# Patient Record
Sex: Female | Born: 1995 | Race: Black or African American | Hispanic: No | Smoking: Never smoker
Health system: Southern US, Community
[De-identification: ages and names within clinical notes are randomized; demographics above are authoritative.]

## PROBLEM LIST (undated history)

## (undated) DIAGNOSIS — D649 Anemia, unspecified: Secondary | ICD-10-CM

## (undated) DIAGNOSIS — J45909 Unspecified asthma, uncomplicated: Secondary | ICD-10-CM

## (undated) HISTORY — DX: Anemia, unspecified: D64.9

---
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
## 2015-05-04 ENCOUNTER — Telehealth: Payer: Self-pay | Admitting: Family Medicine

## 2015-05-04 NOTE — Telephone Encounter (Signed)
Appt scheduled for 06/01/15. Patient aware.

## 2015-06-01 ENCOUNTER — Ambulatory Visit: Payer: Self-pay | Admitting: Family Medicine

## 2015-07-13 ENCOUNTER — Ambulatory Visit: Payer: Self-pay | Admitting: Pediatrics

## 2015-07-16 ENCOUNTER — Ambulatory Visit: Payer: Self-pay | Admitting: Pediatrics

## 2015-07-16 ENCOUNTER — Encounter: Payer: Self-pay | Admitting: Pediatrics

## 2015-07-16 VITALS — BP 132/74 | Ht 63.0 in | Wt 204.0 lb

## 2015-07-16 DIAGNOSIS — Z6836 Body mass index (BMI) 36.0-36.9, adult: Secondary | ICD-10-CM

## 2015-07-16 DIAGNOSIS — Z0000 Encounter for general adult medical examination without abnormal findings: Secondary | ICD-10-CM

## 2015-07-16 DIAGNOSIS — Z113 Encounter for screening for infections with a predominantly sexual mode of transmission: Secondary | ICD-10-CM

## 2015-07-16 DIAGNOSIS — Z30011 Encounter for initial prescription of contraceptive pills: Secondary | ICD-10-CM

## 2015-07-16 MED ORDER — LEVONORGESTREL-ETHINYL ESTRAD 0.1-20 MG-MCG PO TABS: 1.0000 | ORAL_TABLET | Freq: Every day | ORAL | Status: DC

## 2015-07-16 NOTE — Progress Notes (Signed)
    Subjective:    Patient ID: Dorothy Whitaker, female    DOB: May 26, 1996, 19 y.o.   MRN: [id number]  HPI: Dorothy Whitaker is a 19 y.o. female presenting on 07/16/2015 for complete physical exam.  Overall has been healthy per pt. She has not seen in doctor in apprx 5 yrs. She noticed the skin darkening around her neck within the last year. Weight has been up and down per pt, depending on how much she works out.  Going to [facility] [facility] in [place].  No smoking, no EtOH. No drugs.   LMP: 8/16-8/21. Periods skip months, irregular. Recently have been apprx every month, has gone 4 months between periods. Last for 1 week. First days changing pads/tampons every 2-3 hours.Uses condoms for birth control. One lifetime partner, no prior testing.  No headaches, no weakness, does fine with exercise--no SOB. No cough or recent fevers.  Relevant past medical, surgical, family and social history reviewed and updated as indicated. Interim medical history since our last visit reviewed. Allergies and medications reviewed and updated.   ROS: All systems negative other than what is in HPI.  Past Medical History Patient Active Problem List   Diagnosis Date Noted  . Body mass index (BMI) of 36.0-36.9 in adult 07/17/2015    Current Outpatient Prescriptions  Medication Sig Dispense Refill  . levonorgestrel-ethinyl estradiol (AVIANE,ALESSE,LESSINA) 0.1-20 MG-MCG tablet Take 1 tablet by mouth daily. 1 Package 11   No current facility-administered medications for this visit.       Objective:    Ht  (1.6 m)  Wt 204 lb (92.534 kg)  BMI 36.15 kg/m2  Wt Readings from Last 3 Encounters:  07/16/15 204 lb (92.534 kg) (98 %*, Z = 2.00)   * Growth percentiles are based on CDC 2-20 Years data.     Gen: NAD, alert, cooperative with exam, NCAT EYES: EOMI, no scleral injection or icterus ENT:  TMs pearly gray b/l, OP without erythema LYMPH: no cervical LAD CV: NRRR, normal S1/S2, no murmur, DP  pulses 2+ b/l Resp: CTABL, no wheezes, normal WOB Abd: +BS, soft, NTND. no guarding or organomegaly Ext: No edema, warm Neuro: Alert and oriented, strength equal b/l UE and LE, coodrination grossly normal MSK: normal muscle bulk SKIN: hyperpigmentation consistent with acanthosis nigrans over back of neck     Assessment & Plan:   Dorothy Whitaker was seen today for complete physical exam for school. Discussed birth control options, will start PO OCP, no family or personal hx of blood clots, pt without migraines. She is interested in nexplanon in the future.  Diagnoses and all orders for this visit:  Encounter for initial prescription of contraceptive pills -     levonorgestrel-ethinyl estradiol (AVIANE,ALESSE,LESSINA) 0.1-20 MG-MCG tablet; Take 1 tablet by mouth daily.  Laboratory tests ordered as part of a complete physical exam (CPE) -     PPD  Routine screening for STI (sexually transmitted infection) -     GC/Chlamydia Probe Amp  Body mass index (BMI) of 36.0-36.9 in adult Pt going to start going abck to [employer] 4 times a week, also talked about healthy nutrition choices. Will come back for weight check in 2 months.   Follow up plan: Return in 2 months (on 09/15/2015).  Rex Kras, MD [place]

## 2015-07-17 ENCOUNTER — Ambulatory Visit (INDEPENDENT_AMBULATORY_CARE_PROVIDER_SITE_OTHER): Payer: Self-pay | Admitting: *Deleted

## 2015-07-17 ENCOUNTER — Encounter (INDEPENDENT_AMBULATORY_CARE_PROVIDER_SITE_OTHER): Payer: Self-pay

## 2015-07-17 DIAGNOSIS — Z111 Encounter for screening for respiratory tuberculosis: Secondary | ICD-10-CM

## 2015-07-17 DIAGNOSIS — Z6836 Body mass index (BMI) 36.0-36.9, adult: Secondary | ICD-10-CM | POA: Insufficient documentation

## 2015-07-17 NOTE — Patient Instructions (Signed)

## 2015-07-17 NOTE — Progress Notes (Signed)
Pt here today for PPD skin test. PPD placed on left forearm and pt tolerated well.

## 2015-07-18 LAB — GC/CHLAMYDIA PROBE AMP
CHLAMYDIA, DNA PROBE: NEGATIVE
NEISSERIA GONORRHOEAE BY PCR: NEGATIVE

## 2015-07-19 ENCOUNTER — Encounter: Payer: Self-pay | Admitting: *Deleted

## 2015-07-19 LAB — TB SKIN TEST
Induration: 0 mm
TB SKIN TEST: NEGATIVE

## 2015-07-31 ENCOUNTER — Telehealth: Payer: Self-pay | Admitting: Pediatrics

## 2015-07-31 NOTE — Telephone Encounter (Signed)
Patient is requesting that her physical form be faxed when completed.

## 2015-09-21 ENCOUNTER — Encounter (INDEPENDENT_AMBULATORY_CARE_PROVIDER_SITE_OTHER): Payer: Self-pay

## 2015-09-21 ENCOUNTER — Encounter: Payer: Self-pay | Admitting: Pediatrics

## 2015-09-21 ENCOUNTER — Ambulatory Visit (INDEPENDENT_AMBULATORY_CARE_PROVIDER_SITE_OTHER): Payer: Medicaid Other | Admitting: Pediatrics

## 2015-09-21 VITALS — BP 135/87 | HR 89 | Temp 97.2°F | Ht 63.01 in | Wt 202.2 lb

## 2015-09-21 DIAGNOSIS — L83 Acanthosis nigricans: Secondary | ICD-10-CM | POA: Diagnosis not present

## 2015-09-21 DIAGNOSIS — Z6836 Body mass index (BMI) 36.0-36.9, adult: Secondary | ICD-10-CM

## 2015-09-21 DIAGNOSIS — Z68.41 Body mass index (BMI) pediatric, 5th percentile to less than 85th percentile for age: Secondary | ICD-10-CM

## 2015-09-21 DIAGNOSIS — Z23 Encounter for immunization: Secondary | ICD-10-CM | POA: Diagnosis not present

## 2015-09-21 DIAGNOSIS — Z30011 Encounter for initial prescription of contraceptive pills: Secondary | ICD-10-CM

## 2015-09-21 LAB — POCT GLYCOSYLATED HEMOGLOBIN (HGB A1C): HEMOGLOBIN A1C: 5.4

## 2015-09-21 MED ORDER — LEVONORGESTREL-ETHINYL ESTRAD 0.1-20 MG-MCG PO TABS: 1.0000 | ORAL_TABLET | Freq: Every day | ORAL | Status: DC

## 2015-09-21 NOTE — Progress Notes (Signed)
Subjective:    Patient ID: Dorothy Whitaker, female    DOB: 12/07/1995, 19 y.o.   MRN: [id number]  CC: weight f/u  HPI: Dorothy Whitaker is a 19 y.o. female presenting on 09/21/2015 for Follow-up and Rash  Stressed about school, has finals soon Has not tried exercising yet, too much going on, diet includes lots of pasta and raman noodles, also fast food Has noticed a rash that is slightly itchy on inner thighs at times b/l, also on neck. Not there not, not red. hasnt put anything on it, went away on its own LMP: ended one week ago Sexually active not on birth control, uses condoms usually Mood has been fine  Mom had diabetes, a few aunts do as well Mom with colon cancer at 91yo, two aunts with colon cancer   Relevant past medical, surgical, family and social history reviewed and updated as indicated. Interim medical history since our last visit reviewed. Allergies and medications reviewed and updated.   ROS: Per HPI unless specifically indicated above  Past Medical History Patient Active Problem List   Diagnosis Date Noted  . Acanthosis nigricans 09/21/2015  . Body mass index (BMI) of 36.0-36.9 in adult 07/17/2015    Current Outpatient Prescriptions  Medication Sig Dispense Refill  . levonorgestrel-ethinyl estradiol (AVIANE,ALESSE,LESSINA) 0.1-20 MG-MCG tablet Take 1 tablet by mouth daily. 3 Package 3   No current facility-administered medications for this visit.       Objective:    BP 135/87 mmHg  Pulse 89  Temp(Src) 97.2 F (36.2 C) (Oral)  Ht 5' 3.01" (1.6 m)  Wt 202 lb 3.2 oz (91.717 kg)  BMI 35.83 kg/m2  Wt Readings from Last 3 Encounters:  09/21/15 202 lb 3.2 oz (91.717 kg) (98 %*, Z = 1.98)  07/16/15 204 lb (92.534 kg) (98 %*, Z = 2.00)   * Growth percentiles are based on CDC 2-20 Years data.     Gen: NAD, alert, cooperative with exam, NCAT EYES: EOMI, no scleral injection or icterus ENT:  TMs pearly gray b/l, OP without erythema LYMPH: no  cervical LAD CV: NRRR, normal S1/S2, no murmur, distal pulses 2+ b/l Resp: CTABL, no wheezes, normal WOB Abd: +BS, soft, NTND. no guarding or organomegaly Ext: No edema, warm Neuro: Alert and oriented, strength equal b/l UE and LE, coordination grossly normal MSK: normal muscle bulk Skin: hyperpigmented velvety skin around neck, some also present inner thighs, no redness Psych: full affect     Assessment & Plan:   Dorothy Whitaker was seen today for follow-up and rash. Discussed lifestyle changes including diet and exercise, adding vegetables into raman noodle and pasta dishes, minimizing fast food, not getting a meal/fries when she does get fast food, start walking 30 minutes on weekends. Also discussed birth control, will give paper Rx, pt didn't pick up last time because pharmacy called her sister and she didn't want her sister to know. Pt with skin darkening consistent with acanthosis nigricans, discussed etiology, need for life style changes as above. Will get below labs as well. Eucerin or similar thick cream for dry skin. RTC 2 months.  Diagnoses and all orders for this visit:  BMI 36.0-36.9,adult -     TSH -     Lipid panel -     POCT glycosylated hemoglobin (Hb A1C) -     CMP14+EGFR  Encounter for initial prescription of contraceptive pills -     levonorgestrel-ethinyl estradiol (AVIANE,ALESSE,LESSINA) 0.1-20 MG-MCG tablet; Take 1 tablet by mouth daily.  Acanthosis nigricans -     TSH -     Lipid panel -     POCT glycosylated hemoglobin (Hb A1C) -     CMP14+EGFR  Encounter for immunization  Other orders -     Flu Vaccine QUAD 36+ mos IM  I spent 25 minutes with the patient with over 50% of the encounter time dedicated to counseling on the above problems.   Follow up plan: Return in about 2 months (around 11/21/2015).  Assunta Found, MD [facility] 09/21/2015, 8:06 AM

## 2015-09-21 NOTE — Patient Instructions (Signed)
Eucerin

## 2015-09-22 LAB — LIPID PANEL
Chol/HDL Ratio: 6.1 ratio units — ABNORMAL HIGH (ref 0.0–4.4)
Cholesterol, Total: 220 mg/dL — ABNORMAL HIGH (ref 100–169)
HDL: 36 mg/dL — ABNORMAL LOW (ref >39)
LDL Calculated: 162 mg/dL — ABNORMAL HIGH (ref 0–109)
TRIGLYCERIDES: 111 mg/dL — AB (ref 0–89)
VLDL Cholesterol Cal: 22 mg/dL (ref 5–40)

## 2015-09-22 LAB — CMP14+EGFR
ALT: 9 IU/L (ref 0–32)
AST: 15 IU/L (ref 0–40)
Albumin/Globulin Ratio: 1.5 (ref 1.1–2.5)
Albumin: 4.6 g/dL (ref 3.5–5.5)
Alkaline Phosphatase: 104 IU/L (ref 39–117)
BUN/Creatinine Ratio: 11 (ref 8–20)
BUN: 9 mg/dL (ref 6–20)
Bilirubin Total: 0.3 mg/dL (ref 0.0–1.2)
CALCIUM: 9.8 mg/dL (ref 8.7–10.2)
CO2: 22 mmol/L (ref 18–29)
CREATININE: 0.82 mg/dL (ref 0.57–1.00)
Chloride: 99 mmol/L (ref 97–106)
GFR calc Af Amer: 120 mL/min/{1.73_m2} (ref >59)
GFR, EST NON AFRICAN AMERICAN: 104 mL/min/{1.73_m2} (ref >59)
GLOBULIN, TOTAL: 3 g/dL (ref 1.5–4.5)
GLUCOSE: 74 mg/dL (ref 65–99)
Potassium: 4.4 mmol/L (ref 3.5–5.2)
SODIUM: 138 mmol/L (ref 136–144)
Total Protein: 7.6 g/dL (ref 6.0–8.5)

## 2015-09-22 LAB — TSH: TSH: 2.08 u[IU]/mL (ref 0.450–4.500)

## 2015-11-23 ENCOUNTER — Ambulatory Visit (INDEPENDENT_AMBULATORY_CARE_PROVIDER_SITE_OTHER): Payer: Medicaid Other | Admitting: Pediatrics

## 2015-11-23 ENCOUNTER — Encounter: Payer: Self-pay | Admitting: Pediatrics

## 2015-11-23 VITALS — BP 139/81 | HR 71 | Temp 99.1°F | Ht 63.02 in | Wt 200.0 lb

## 2015-11-23 DIAGNOSIS — Z30011 Encounter for initial prescription of contraceptive pills: Secondary | ICD-10-CM

## 2015-11-23 DIAGNOSIS — Z6836 Body mass index (BMI) 36.0-36.9, adult: Secondary | ICD-10-CM

## 2015-11-23 MED ORDER — LEVONORGESTREL-ETHINYL ESTRAD 0.1-20 MG-MCG PO TABS: 1.0000 | ORAL_TABLET | Freq: Every day | ORAL | Status: DC

## 2015-11-23 NOTE — Progress Notes (Signed)
    Subjective:    Patient ID: Dorothy Whitaker, female    DOB: 1996/01/05, 20 y.o.   MRN: [id number]  CC: Follow-up BMI, birth control  HPI: Dorothy Whitaker is a 20 y.o. female presenting for Follow-up  Going well per pt Not exercising, still interested in losing weight has not made any changes since last visit School going well  24h recall food: Skipped breakfast and lunch, no snacks Went to [employer], had shrimp and french fries  Sexually active, using condoms   Depression screen [facility] 2/9 11/23/2015 09/21/2015 07/16/2015  Decreased Interest 0 0 0  Down, Depressed, Hopeless 0 0 0  PHQ - 2 Score 0 0 0     Relevant past medical, surgical, family and social history reviewed and updated as indicated. Interim medical history since our last visit reviewed. Allergies and medications reviewed and updated.    ROS: Per HPI unless specifically indicated above  History  Smoking status  . Never Smoker   Smokeless tobacco  . Not on file    Past Medical History Patient Active Problem List   Diagnosis Date Noted  . Acanthosis nigricans 09/21/2015  . Body mass index (BMI) of 36.0-36.9 in adult 07/17/2015    Current Outpatient Prescriptions  Medication Sig Dispense Refill  . levonorgestrel-ethinyl estradiol (AVIANE,ALESSE,LESSINA) 0.1-20 MG-MCG tablet Take 1 tablet by mouth daily. 3 Package 3   No current facility-administered medications for this visit.       Objective:    BP 139/81 mmHg  Pulse 71  Temp(Src) 99.1 F (37.3 C) (Oral)  Ht 5' 3.02" (1.601 m)  Wt 200 lb (90.719 kg)  BMI 35.39 kg/m2  LMP 11/09/2015  Wt Readings from Last 3 Encounters:  11/23/15 200 lb (90.719 kg) (97 %*, Z = 1.94)  09/21/15 202 lb 3.2 oz (91.717 kg) (98 %*, Z = 1.98)  07/16/15 204 lb (92.534 kg) (98 %*, Z = 2.00)   * Growth percentiles are based on CDC 2-20 Years data.     Gen: NAD, alert, cooperative with exam, NCAT EYES: EOMI, no scleral injection or icterus ENT:  OP without  erythema LYMPH: no cervical LAD CV: NRRR, normal S1/S2, no murmur, distal pulses 2+ b/l Resp: CTABL, no wheezes, normal WOB Abd: +BS, soft, NTND. no guarding or organomegaly Ext: No edema, warm Neuro: Alert and oriented MSK: normal muscle bulk Skin: acanthosis nigrans along neck     Assessment & Plan:    Dorothy Whitaker was seen today for follow-up BMI. Weight stable, pt has not made any of discussed weight loss changes. Is interested in learning more about foods to eat or not to eat, eats a lot of convenience foods, pasta now. Will have her f/u with Tammy. No side effects from OCP, continue.  Diagnoses and all orders for this visit:  Encounter for initial prescription of contraceptive pills -     levonorgestrel-ethinyl estradiol (AVIANE,ALESSE,LESSINA) 0.1-20 MG-MCG tablet; Take 1 tablet by mouth daily.  Body mass index (BMI) of 36.0-36.9 in adult Discussed lifestyle changes, diet changes. Goals set by pt:  Eat three meals every day, even if one is a piece of fruit  Pasta no more than 2 days a week  Increase fruits and vegetables in diet  Gym 3 times a week  Follow up plan: Return for Tammy in 4 weeks for weight loss/nutrition, Dr. Oswaldo Done in 6 months.  Rex Kras, MD [facility] 11/23/2015, 8:20 AM

## 2015-11-23 NOTE — Patient Instructions (Signed)
Goals:  Eat three meals every day, even if one is a piece of fruit  Pasta no more than 2 days a week  Increase fruits and vegetables in diet  Gym 3 times a week

## 2015-12-24 ENCOUNTER — Encounter: Payer: Medicaid Other | Admitting: Pharmacist

## 2015-12-25 ENCOUNTER — Encounter: Payer: Self-pay | Admitting: Pediatrics

## 2016-04-03 ENCOUNTER — Ambulatory Visit: Payer: Medicaid Other | Admitting: Family Medicine

## 2016-04-08 ENCOUNTER — Ambulatory Visit: Payer: Medicaid Other | Admitting: Family Medicine

## 2016-05-23 ENCOUNTER — Ambulatory Visit: Payer: Medicaid Other | Admitting: Pediatrics

## 2016-05-26 ENCOUNTER — Encounter: Payer: Self-pay | Admitting: Pediatrics

## 2017-08-26 ENCOUNTER — Ambulatory Visit (INDEPENDENT_AMBULATORY_CARE_PROVIDER_SITE_OTHER): Payer: Self-pay | Admitting: Pediatrics

## 2017-08-26 ENCOUNTER — Encounter: Payer: Self-pay | Admitting: Pediatrics

## 2017-08-26 VITALS — BP 108/75 | HR 71 | Temp 98.3°F | Ht 63.02 in | Wt 179.4 lb

## 2017-08-26 DIAGNOSIS — R200 Anesthesia of skin: Secondary | ICD-10-CM

## 2017-08-26 DIAGNOSIS — F430 Acute stress reaction: Secondary | ICD-10-CM

## 2017-08-26 DIAGNOSIS — M5440 Lumbago with sciatica, unspecified side: Secondary | ICD-10-CM

## 2017-08-26 DIAGNOSIS — Z23 Encounter for immunization: Secondary | ICD-10-CM
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
## 2017-09-02 ENCOUNTER — Ambulatory Visit: Payer: Self-pay | Admitting: Pediatrics

## 2017-09-09 ENCOUNTER — Encounter: Payer: Self-pay | Admitting: Pediatrics

## 2017-09-09 ENCOUNTER — Ambulatory Visit (INDEPENDENT_AMBULATORY_CARE_PROVIDER_SITE_OTHER): Payer: Self-pay | Admitting: Pediatrics

## 2017-09-09 VITALS — BP 116/74 | HR 85 | Temp 99.0°F | Ht 63.02 in | Wt 176.8 lb

## 2017-09-09 DIAGNOSIS — F419 Anxiety disorder, unspecified: Secondary | ICD-10-CM

## 2017-09-09 NOTE — Patient Instructions (Signed)
[email or web address]  [facility] Address: [address], [place], [place] [address] Phone: [phone]  [facility] [address], [place], [place] [address]  Phone # [phone]   Your provider wants you to schedule an appointment with a Psychologist/Psychiatrist. The following list of offices requires the patient to call and make their own appointment, as there is information they need that only you can provide. Please feel free to choose form the following providers:  [facility]   [phone] Crisis Recovery [place] [phone]  [facility]  [phone]   [address] [place], [place]  (Scheduled through [facility]) Must call and do an interview for appointment. Sees Children / Accepts Medicaid  [facility]    [phone]  [address]    [place], [place]       [facility]  [phone] [address] [place], [place]  Evaluates for Autism but does not treat it Sees Children / Accepts Medicaid  [facility]    [phone] [address]   [place], [place] Medication management, substance abuse, bipolar, grief, family, marriage, OCD, anxiety, PTSD Sees children / Accepts Medicaid  [facility]    [phone] [address] [place], [place] Sees children / Accepts [facility]  [facility]  [phone] [address] [place], [place]   Dr Estelle GrumblesAkinlayo     [phone] [place], [place]  Sees ADD & ADHD for treatment Accepts Medicaid  [facility]  [phone] [place], [place] Evaluates for Autism Accepts [facility]  [facility]  [phone] [address] [place], [place]  Does Adult ADD evaluations Does not accept Medicaid  [facility]   [phone] [address]   [place], [place] Uses animal therapy  Sees children as young as 21 years old [facility]s [facility]  [facility]     [phone]    [address]  [place], [place] [address] Sees children Accepts Medicaid
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
## 2017-09-10 ENCOUNTER — Ambulatory Visit (INDEPENDENT_AMBULATORY_CARE_PROVIDER_SITE_OTHER): Payer: Self-pay | Admitting: Pediatrics

## 2017-09-10 ENCOUNTER — Encounter: Payer: Self-pay | Admitting: Pediatrics

## 2017-09-10 ENCOUNTER — Telehealth: Payer: Self-pay | Admitting: Pediatrics

## 2017-09-10 DIAGNOSIS — Z202 Contact with and (suspected) exposure to infections with a predominantly sexual mode of transmission: Secondary | ICD-10-CM

## 2017-09-10 DIAGNOSIS — Z113 Encounter for screening for infections with a predominantly sexual mode of transmission: Secondary | ICD-10-CM

## 2017-09-10 LAB — WET PREP FOR TRICH, YEAST, CLUE
Clue Cell Exam: NEGATIVE
TRICHOMONAS EXAM: NEGATIVE
Yeast Exam: NEGATIVE

## 2017-09-10 MED ORDER — AZITHROMYCIN 500 MG PO TABS: 1000.0000 mg | ORAL_TABLET | Freq: Once | ORAL | 0 refills | Status: AC

## 2017-09-10 NOTE — Telephone Encounter (Signed)
Called pt back, will come in to see me today at 415.

## 2017-09-10 NOTE — Patient Instructions (Addendum)
Your provider wants you to schedule an appointment with a Psychologist/Psychiatrist. The following list of offices requires the patient to call and make their own appointment, as there is information they need that only you can provide. Please feel free to choose form the following providers:  [facility]   [phone] Crisis Recovery in [place] [phone]  [facility]  [phone]   [address] [place], [place]  (Scheduled through [facility]) Must call and do an interview for appointment. Sees Children / Accepts Medicaid  [facility]    [phone]  [address]    [place], [place]       [facility]  [phone] [address] [place], [place]  Evaluates for Autism but does not treat it Sees Children / Accepts Medicaid  [facility]    [phone] [address]   [place], [place] Medication management, substance abuse, bipolar, grief, family, marriage, OCD, anxiety, PTSD Sees children / Accepts Medicaid  [facility]    [phone] [address] [place], [place] Sees children / Accepts Medicaid  [facility]  [phone] [address] [place], [place]   Dr Akinlayo     [phone] [address] [place], [place]  Sees ADD & ADHD for treatment Accepts Medicaid  [facility]  [phone] [place], [place] Evaluates for Autism Accepts Medicaid   [facility]  [phone] [address] [place], [place]  Does Adult ADD evaluations Does not accept Medicaid  [facility]   [phone] [address]   [place], [place] Uses animal therapy  Sees children as young as 3 years old Accepts Medicaid  [facility]     [phone]    [address]  [place], [place] [address] Sees children Accepts Medicaid  

## 2017-09-11 LAB — RPR: RPR Ser Ql: NONREACTIVE

## 2017-09-11 LAB — HIV ANTIBODY (ROUTINE TESTING W REFLEX): HIV Screen 4th Generation wRfx: NONREACTIVE

## 2017-09-13 LAB — GC/CHLAMYDIA PROBE AMP
Chlamydia trachomatis, NAA: POSITIVE — AB
NEISSERIA GONORRHOEAE BY PCR: NEGATIVE

## 2017-09-13 NOTE — Progress Notes (Signed)
Recevied phone call, was told she had been exposed to chlamydia. Here for testing and treatment. Asymptomatic. No vaginal discharge. Exposure was 1.5 months ago. Has had regular periods since then. No sexual partners since then.  Will test for below. Given exposure, will go ahead and treat for chlamydia.  Routine screening for STI (sexually transmitted infection) -     WET PREP FOR TRICH, YEAST, CLUE -     HIV antibody -     RPR -     GC/Chlamydia Probe Amp  Exposure to chlamydia -     azithromycin (ZITHROMAX) 500 MG tablet; Take 2 tablets (1,000 mg total) once for 1 dose by mouth.

## 2017-09-14 ENCOUNTER — Telehealth: Payer: Self-pay | Admitting: Pediatrics

## 2017-09-14 ENCOUNTER — Other Ambulatory Visit: Payer: Self-pay | Admitting: Pediatrics

## 2017-09-14 DIAGNOSIS — A749 Chlamydial infection, unspecified: Secondary | ICD-10-CM

## 2017-09-14 MED ORDER — AZITHROMYCIN 500 MG PO TABS: 1000.0000 mg | ORAL_TABLET | Freq: Once | ORAL | 0 refills | Status: AC

## 2017-09-14 NOTE — Telephone Encounter (Signed)
Talked with pt, she has not yet picked up azithromycin treatment for chlamydia. Discussed again she needs to take this medication, her test was positive. [facility]. Can you call her tomorrow to confirm she has taken it? (09/15/2017)

## 2017-09-14 NOTE — Telephone Encounter (Signed)
Line busy, did not go to voicemail jkp 11/12

## 2017-09-14 NOTE — Telephone Encounter (Signed)
DR Oswaldo DoneVincent - can you take a look at the note attached to her recent lab??

## 2017-10-01 NOTE — Telephone Encounter (Signed)
lmtcb

## 2017-10-02 NOTE — Telephone Encounter (Signed)
Spoke with patient.  Patient states that she is not having any symptoms at this time and really does not want to go through testing again.   Informed patient of testing and treating early.  Patient verbalized understanding and states that she has finished all antibiotic.   Informed patient that she does need to come in, in two week to be retested to make sure it is gone.  Declines appt for today 11/30 but scheduled for 2 week appt.

## 2017-10-02 NOTE — Telephone Encounter (Signed)
Can she come in now for exam? Needs exam before shot. Can see her at [phone].

## 2017-10-02 NOTE — Telephone Encounter (Signed)
lmtcb

## 2017-10-02 NOTE — Telephone Encounter (Signed)
Patient states that she did complete the azithromycin but she would also like to come in and get the shot that was offered. Rocephin? Patient states that she is having symptoms now. Please advise

## 2017-10-16 ENCOUNTER — Ambulatory Visit: Payer: Medicaid Other | Admitting: Pediatrics

## 2017-10-17 ENCOUNTER — Encounter: Payer: Self-pay | Admitting: Pediatrics

## 2017-12-17 ENCOUNTER — Ambulatory Visit: Payer: Self-pay | Admitting: Pediatrics

## 2018-01-29 ENCOUNTER — Telehealth: Payer: Self-pay | Admitting: Pediatrics

## 2018-01-29 MED ORDER — FLUCONAZOLE 150 MG PO TABS: 150.0000 mg | ORAL_TABLET | Freq: Once | ORAL | 0 refills | Status: AC

## 2018-01-29 NOTE — Telephone Encounter (Signed)
Itchy red rash and vaginal introitus.  No rash elsewhere on the skin in her genital area.  Started a a couple of days ago.  Exercise makes it worse.  Denies any new lotions, fragrances, products.  Similar rash and irritation happened when she was regularly using [employer] wash in the past.  No new sexual partners.  No vaginal discharge.  Will do trial of one-time dose of fluconazole.  If not improving needs to be seen.

## 2018-01-29 NOTE — Telephone Encounter (Signed)
Patient wanting to talk to you about a rash. She would not give me any other information.

## 2018-05-19 ENCOUNTER — Telehealth: Payer: Self-pay | Admitting: Pediatrics

## 2018-05-19 NOTE — Telephone Encounter (Signed)
Attempted to contact patient - needing to know what she needs the note for before sending it to the provider  .

## 2018-05-19 NOTE — Telephone Encounter (Signed)
lmtcb

## 2018-05-19 NOTE — Telephone Encounter (Signed)
Returning call.

## 2018-05-20 NOTE — Telephone Encounter (Signed)
OK to write note

## 2018-05-21 NOTE — Telephone Encounter (Signed)
Letter printed and faxed per pt request. Pt is aware.

## 2018-09-28 ENCOUNTER — Emergency Department (HOSPITAL_COMMUNITY): Payer: Self-pay

## 2018-09-28 ENCOUNTER — Encounter (HOSPITAL_COMMUNITY): Payer: Self-pay | Admitting: Emergency Medicine

## 2018-09-28 ENCOUNTER — Other Ambulatory Visit: Payer: Self-pay

## 2018-09-28 ENCOUNTER — Emergency Department (HOSPITAL_COMMUNITY)
Admission: EM | Admit: 2018-09-28 | Discharge: 2018-09-28 | Disposition: A | Discharge status: Home / Self Care | Payer: Self-pay | Attending: Emergency Medicine | Admitting: Emergency Medicine

## 2018-09-28 DIAGNOSIS — J40 Bronchitis, not specified as acute or chronic: Secondary | ICD-10-CM | POA: Insufficient documentation

## 2018-09-28 DIAGNOSIS — B009 Herpesviral infection, unspecified: Secondary | ICD-10-CM | POA: Insufficient documentation

## 2018-09-28 DIAGNOSIS — J020 Streptococcal pharyngitis: Secondary | ICD-10-CM | POA: Insufficient documentation

## 2018-09-28 LAB — GROUP A STREP BY PCR: Group A Strep by PCR: DETECTED — AB

## 2018-09-28 MED ORDER — ACYCLOVIR 400 MG PO TABS: 400.0000 mg | ORAL_TABLET | Freq: Four times a day (QID) | ORAL | 0 refills | Status: DC

## 2018-09-28 MED ORDER — PENICILLIN G BENZATHINE 1200000 UNIT/2ML IM SUSP
1.2000 10*6.[IU] | Freq: Once | INTRAMUSCULAR | Status: AC
  Administered 2018-09-28: 1.2 10*6.[IU] via INTRAMUSCULAR
  Filled 2018-09-28: qty 2

## 2018-09-28 MED ORDER — IPRATROPIUM-ALBUTEROL 0.5-2.5 (3) MG/3ML IN SOLN
3.0000 mL | Freq: Once | RESPIRATORY_TRACT | Status: AC
  Administered 2018-09-28: 3 mL via RESPIRATORY_TRACT
  Filled 2018-09-28: qty 3

## 2018-09-28 MED ORDER — ALBUTEROL SULFATE HFA 108 (90 BASE) MCG/ACT IN AERS
1.0000 | INHALATION_SPRAY | Freq: Once | RESPIRATORY_TRACT | Status: AC
  Administered 2018-09-28: 2 via RESPIRATORY_TRACT
  Filled 2018-09-28: qty 6.7

## 2018-09-28 NOTE — ED Triage Notes (Signed)
Pt here with fever to 103.2 at home, cough, fever blisters around mouth and bilateral ear pain since Saturday. Last took tylenol at 7am for fever.

## 2018-09-28 NOTE — ED Notes (Signed)
To xray with tranporter

## 2018-09-28 NOTE — Discharge Instructions (Addendum)
Please read attached information. If you experience any new or worsening signs or symptoms please return to the emergency room for evaluation. Please follow-up with your primary care provider or specialist as discussed. Please use medication prescribed only as directed and discontinue taking if you have any concerning signs or symptoms.  The acyclovir can be used for 5 to 10 days, he may discontinue using when symptoms resolve.

## 2018-09-28 NOTE — ED Provider Notes (Signed)
[facility] EMERGENCY DEPARTMENT Provider Note   CSN: [id number] Arrival date & time: 09/28/18  4098     History   Chief Complaint Chief Complaint  Patient presents with  . Fever  . Otalgia  . Cough  . Blister    HPI Dorothy Whitaker is a 22 y.o. female.  HPI   22 year old female presents today with complaints of upper respiratory infection and cold sore.  Patient notes 3 days of rhinorrhea nasal congestion and cough.  Patient notes bilateral ear pain.  The symptoms started approximately 3 days ago, yesterday she noticed a cold sore to her left lower lip, she denies any significant shortness of breath, she does note cough.  She notes minor sore throat-no difficulty swallowing, she denies any intraoral lesions or lesions on her skin.  Patient notes a history of cold sores and that this is similar but more severe.  She notes a fever of 103 at home noting she has been taking Tylenol, she reports taking Tylenol this morning.  Reports a history of asthma, reports she is a non-smoker.  No inhaler at home.  She denies any close sick contacts.  History reviewed. No pertinent past medical history.  Patient Active Problem List   Diagnosis Date Noted  . Acanthosis nigricans 09/21/2015  . Body mass index (BMI) of 36.0-36.9 in adult 07/17/2015    History reviewed. No pertinent surgical history.   OB History   None      Home Medications    Prior to Admission medications   Medication Sig Start Date End Date Taking? Authorizing Provider  acyclovir (ZOVIRAX) 400 MG tablet Take 1 tablet (400 mg total) by mouth 4 (four) times daily. 09/28/18   Eyvonne Mechanic, PA-C    Family History History reviewed. No pertinent family history.  Social History Social History   Tobacco Use  . Smoking status: Never Smoker  . Smokeless tobacco: Never Used  Substance Use Topics  . Alcohol use: No  . Drug use: No     Allergies   Patient has no known  allergies.   Review of Systems Review of Systems  All other systems reviewed and are negative.   Physical Exam Updated Vital Signs BP 131/78 (BP Location: Right Arm)   Pulse 87   Temp 97.6 F (36.4 C) (Oral)   Resp 16   Ht 5\' 3"  (1.6 m)   Wt 79.4 kg   LMP 07/29/2018   SpO2 100%   BMI 31.00 kg/m   Physical Exam  Constitutional: She is oriented to person, place, and time. She appears well-developed and well-nourished.  HENT:  Head: Normocephalic and atraumatic.  Oropharynx clear without swelling edema- minor erythema noted, no exudate-neck supple full active range of motion no tender cervical lymphadenopathy-vesicles on erythematous base at the right lower lip-no intraoral lesions noted  Eyes: Pupils are equal, round, and reactive to light. Conjunctivae are normal. Right eye exhibits no discharge. Left eye exhibits no discharge. No scleral icterus.  Neck: Normal range of motion. No JVD present. No tracheal deviation present.  Pulmonary/Chest: Effort normal. No stridor.  Minor expiratory wheeze left lower lobe  Neurological: She is alert and oriented to person, place, and time. Coordination normal.  Skin:  No rashes  Psychiatric: She has a normal mood and affect. Her behavior is normal. Judgment and thought content normal.  Nursing note and vitals reviewed.    ED Treatments / Results  Labs (all labs ordered are listed, but only abnormal results are  displayed) Labs Reviewed  GROUP A STREP BY PCR - Abnormal; Notable for the following components:      Result Value   Group A Strep by PCR DETECTED (*)    All other components within normal limits    EKG None  Radiology Dg Chest 2 View  Result Date: 09/28/2018 CLINICAL DATA:  Fever and cough. EXAM: CHEST - 2 VIEW COMPARISON:  08/10/2017 FINDINGS: The heart size and mediastinal contours are within normal limits. Both lungs are clear except for slight peribronchial thickening on the lateral view. No effusions. Minimal  linear scarring at the left lung base. The visualized skeletal structures are unremarkable. IMPRESSION: Slight bronchitic changes. Electronically Signed   By: Francene BoyersJames  Maxwell M.D.   On: 09/28/2018 10:28    Procedures Procedures (including critical care time)  Medications Ordered in ED Medications  ipratropium-albuterol (DUONEB) 0.5-2.5 (3) MG/3ML nebulizer solution 3 mL (3 mLs Nebulization Given 09/28/18 1035)  penicillin g benzathine (BICILLIN LA) 1200000 UNIT/2ML injection 1.2 Million Units (1.2 Million Units Intramuscular Given 09/28/18 1146)  albuterol (PROVENTIL HFA;VENTOLIN HFA) 108 (90 Base) MCG/ACT inhaler 1-2 puff (2 puffs Inhalation Given 09/28/18 1158)     Initial Impression / Assessment and Plan / ED Course  I have reviewed the triage vital signs and the nursing notes.  Pertinent labs & imaging results that were available during my care of the patient were reviewed by me and considered in my medical decision making (see chart for details).     Labs: Group A Strep  Imaging: DG Chest 2 view  Consults:  Therapeutics:  Discharge Meds:   Assessment/Plan: [age] YOF presents today with group A strep pharyngitis, bronchitis, and HSV.  Patient treated with acyclovir and albuterol.  Strict return precautions given, she verbalized understanding and agreement to today's plan.    Final Clinical Impressions(s) / ED Diagnoses   Final diagnoses:  Strep pharyngitis  HSV (herpes simplex virus) infection  Bronchitis    ED Discharge Orders         Ordered    acyclovir (ZOVIRAX) 400 MG tablet  4 times daily     09/28/18 1154           Eyvonne MechanicHedges, Jobani Sabado, PA-C 09/29/18 1153    Eber HongMiller, Brian, MD 09/30/18 1148

## 2019-05-06 IMAGING — DX DG CHEST 2V
2 series · 2 of 2 positions shown · non-contrast
Comparison: 08/10/2017

CLINICAL DATA: Fever and cough.

EXAM:
CHEST - 2 VIEW

[chest pa]
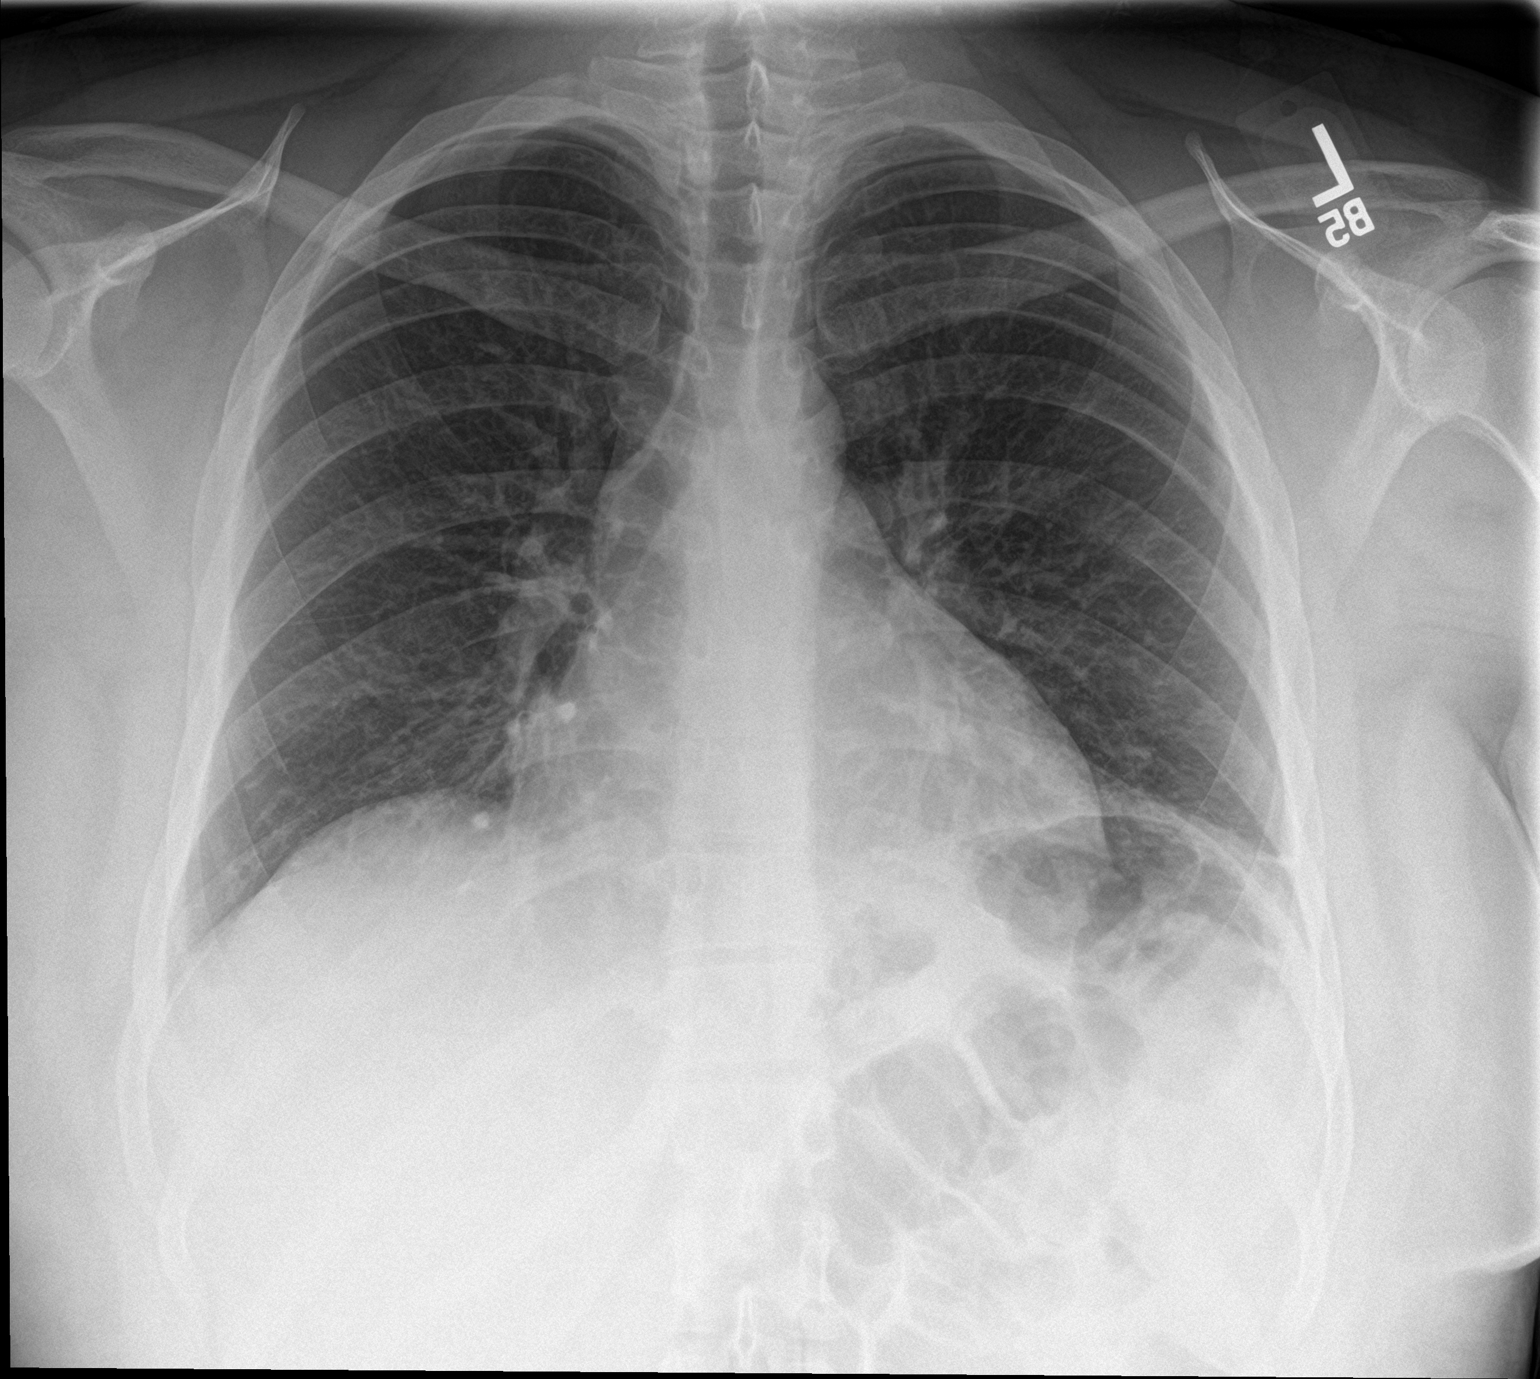

[chest lat]
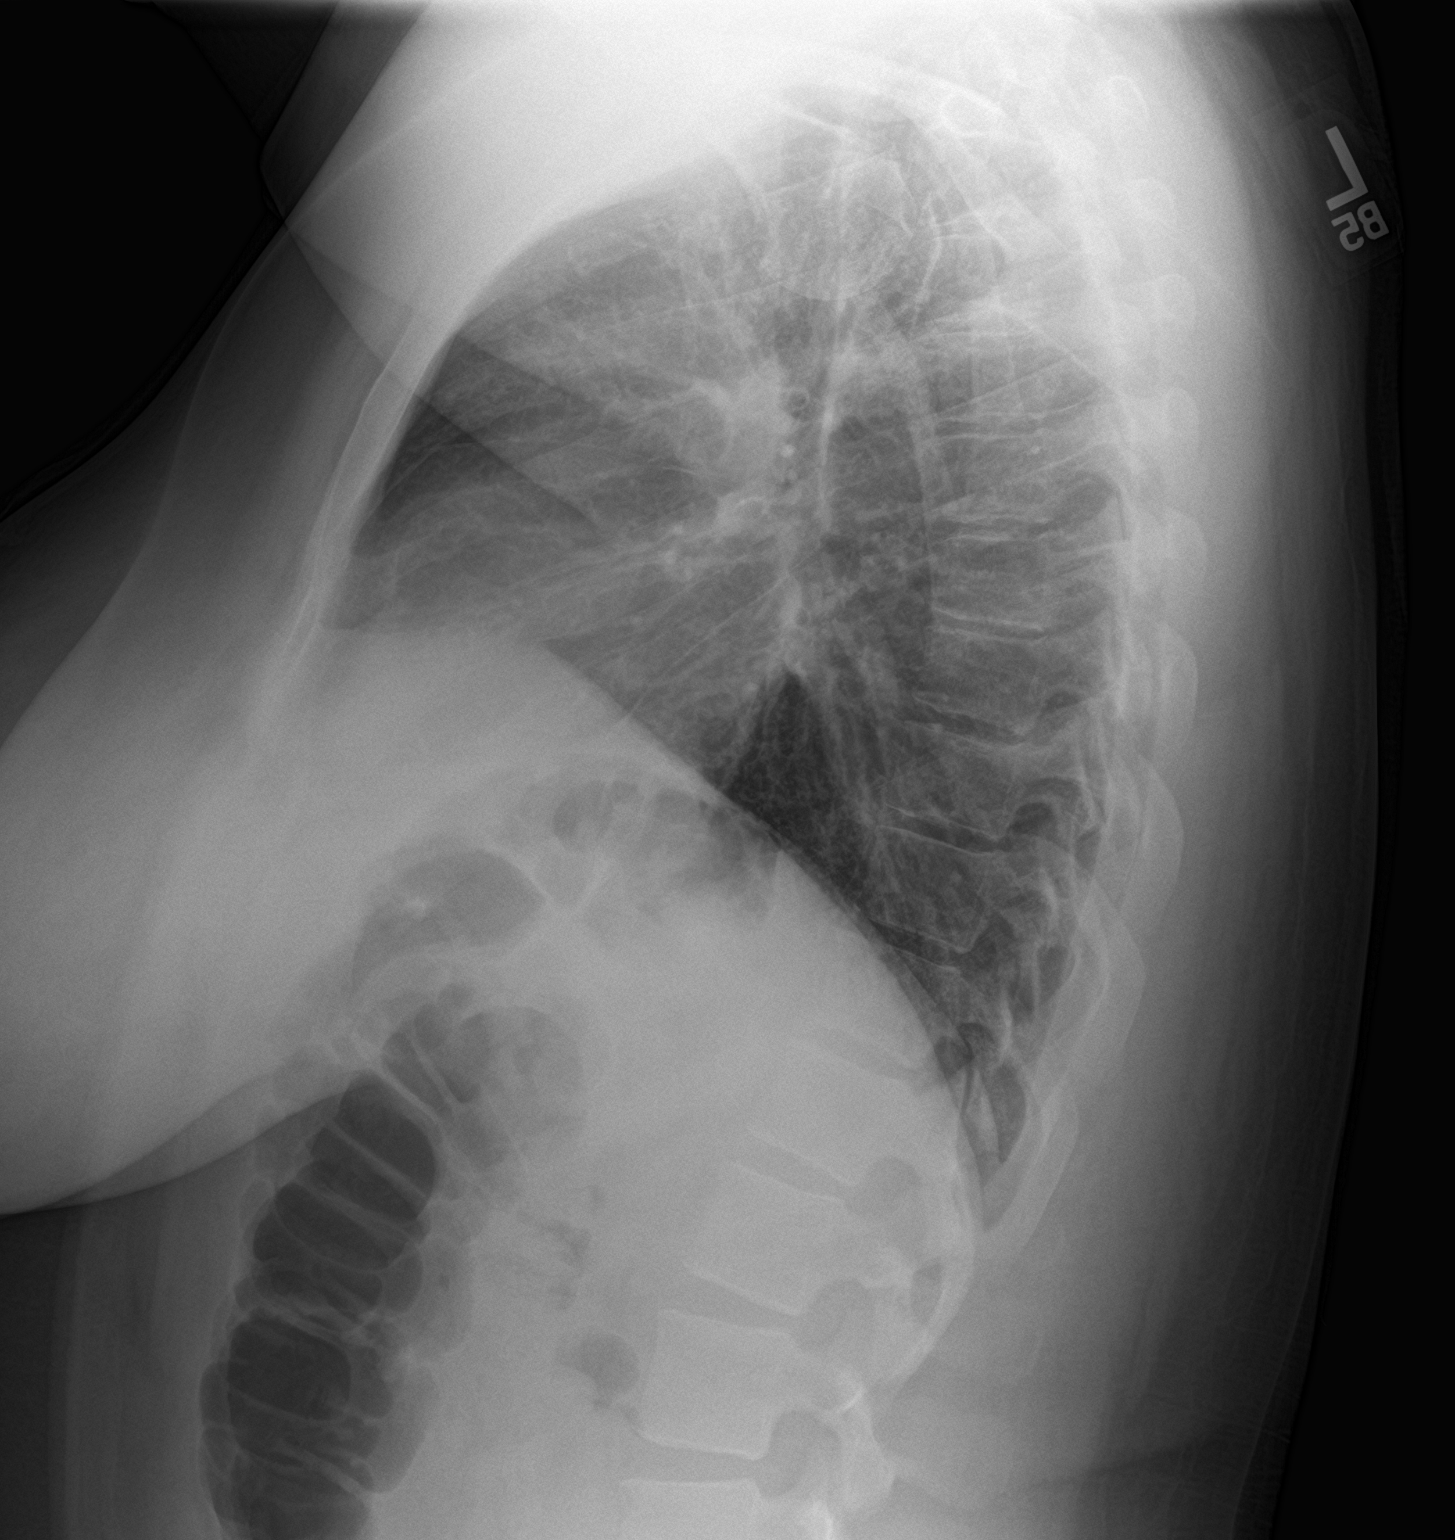

[2 of 2 positions shown; findings below may reference images not displayed]

FINDINGS: The heart size and mediastinal contours are within normal limits.
Both lungs are clear except for slight peribronchial thickening on
the lateral view. No effusions. Minimal linear scarring at the left
lung base. The visualized skeletal structures are unremarkable.
IMPRESSION: Slight bronchitic changes.

## 2019-11-04 DIAGNOSIS — Z5189 Encounter for other specified aftercare: Secondary | ICD-10-CM

## 2019-11-04 HISTORY — DX: Encounter for other specified aftercare: Z51.89

## 2019-11-14 ENCOUNTER — Encounter (HOSPITAL_COMMUNITY): Payer: Self-pay

## 2019-11-14 ENCOUNTER — Observation Stay (HOSPITAL_COMMUNITY)
Admission: EM | Admit: 2019-11-14 | Discharge: 2019-11-15 | Disposition: A | Discharge status: Home / Self Care | Payer: Medicaid Other | Attending: Internal Medicine | Admitting: Internal Medicine

## 2019-11-14 ENCOUNTER — Other Ambulatory Visit: Payer: Self-pay

## 2019-11-14 DIAGNOSIS — R109 Unspecified abdominal pain: Secondary | ICD-10-CM | POA: Insufficient documentation

## 2019-11-14 DIAGNOSIS — N939 Abnormal uterine and vaginal bleeding, unspecified: Secondary | ICD-10-CM

## 2019-11-14 DIAGNOSIS — Z20822 Contact with and (suspected) exposure to covid-19: Secondary | ICD-10-CM | POA: Insufficient documentation

## 2019-11-14 DIAGNOSIS — D649 Anemia, unspecified: Secondary | ICD-10-CM | POA: Diagnosis present

## 2019-11-14 DIAGNOSIS — R000 Tachycardia, unspecified: Secondary | ICD-10-CM | POA: Insufficient documentation

## 2019-11-14 DIAGNOSIS — D509 Iron deficiency anemia, unspecified: Principal | ICD-10-CM | POA: Insufficient documentation

## 2019-11-14 DIAGNOSIS — N920 Excessive and frequent menstruation with regular cycle: Secondary | ICD-10-CM | POA: Diagnosis present

## 2019-11-14 DIAGNOSIS — R110 Nausea: Secondary | ICD-10-CM | POA: Insufficient documentation

## 2019-11-14 HISTORY — DX: Unspecified asthma, uncomplicated: J45.909

## 2019-11-14 LAB — COMPREHENSIVE METABOLIC PANEL
ALT: 16 U/L (ref 0–44)
AST: 15 U/L (ref 15–41)
Albumin: 4.5 g/dL (ref 3.5–5.0)
Alkaline Phosphatase: 111 U/L (ref 38–126)
Anion gap: 11 (ref 5–15)
BUN: 10 mg/dL (ref 6–20)
CO2: 24 mmol/L (ref 22–32)
Calcium: 9.3 mg/dL (ref 8.9–10.3)
Chloride: 101 mmol/L (ref 98–111)
Creatinine, Ser: 0.82 mg/dL (ref 0.44–1.00)
GFR calc Af Amer: >60 mL/min (ref >60)
GFR calc non Af Amer: >60 mL/min (ref >60)
Glucose, Bld: 105 mg/dL — ABNORMAL HIGH (ref 70–99)
Potassium: 3.9 mmol/L (ref 3.5–5.1)
Sodium: 136 mmol/L (ref 135–145)
Total Bilirubin: 1 mg/dL (ref 0.3–1.2)
Total Protein: 8.6 g/dL — ABNORMAL HIGH (ref 6.5–8.1)

## 2019-11-14 LAB — LIPASE, BLOOD: Lipase: 22 U/L (ref 11–51)

## 2019-11-14 LAB — CBC
HCT: 23.2 % — ABNORMAL LOW (ref 36.0–46.0)
Hemoglobin: 6.1 g/dL — CL (ref 12.0–15.0)
MCH: 15.4 pg — ABNORMAL LOW (ref 26.0–34.0)
MCHC: 26.3 g/dL — ABNORMAL LOW (ref 30.0–36.0)
MCV: 58.7 fL — ABNORMAL LOW (ref 80.0–100.0)
Platelets: 380 10*3/uL (ref 150–400)
RBC: 3.95 MIL/uL (ref 3.87–5.11)
RDW: 21 % — ABNORMAL HIGH (ref 11.5–15.5)
WBC: 11.1 10*3/uL — ABNORMAL HIGH (ref 4.0–10.5)
nRBC: 0.5 % — ABNORMAL HIGH (ref 0.0–0.2)

## 2019-11-14 LAB — I-STAT BETA HCG BLOOD, ED (MC, WL, AP ONLY): I-stat hCG, quantitative: <5 m[IU]/mL (ref <5)

## 2019-11-14 MED ORDER — SODIUM CHLORIDE 0.9% FLUSH: 3.0000 mL | Freq: Once | INTRAVENOUS | Status: DC

## 2019-11-14 NOTE — ED Triage Notes (Signed)
Patient arrived stating she has a history of irregular periods.  Reports being on her cycle over the last 3 weeks Reporting abdominal pain, nausea, and vomiting over the last 3 days.

## 2019-11-14 NOTE — ED Notes (Signed)
Date and time results received: 11/14/19  11:44 PM  Test: Hemoglobin Critical Value: 6.1  Name of Provider Notified: Pollina

## 2019-11-15 ENCOUNTER — Encounter (HOSPITAL_COMMUNITY): Payer: Self-pay | Admitting: Family Medicine

## 2019-11-15 DIAGNOSIS — J45909 Unspecified asthma, uncomplicated: Secondary | ICD-10-CM | POA: Insufficient documentation

## 2019-11-15 DIAGNOSIS — N920 Excessive and frequent menstruation with regular cycle: Secondary | ICD-10-CM | POA: Diagnosis present

## 2019-11-15 DIAGNOSIS — D649 Anemia, unspecified: Secondary | ICD-10-CM

## 2019-11-15 LAB — CBC
HCT: 28.4 % — ABNORMAL LOW (ref 36.0–46.0)
Hemoglobin: 8.5 g/dL — ABNORMAL LOW (ref 12.0–15.0)
MCH: 19.7 pg — ABNORMAL LOW (ref 26.0–34.0)
MCHC: 29.9 g/dL — ABNORMAL LOW (ref 30.0–36.0)
MCV: 65.9 fL — ABNORMAL LOW (ref 80.0–100.0)
Platelets: 317 10*3/uL (ref 150–400)
RBC: 4.31 MIL/uL (ref 3.87–5.11)
RDW: 27.7 % — ABNORMAL HIGH (ref 11.5–15.5)
WBC: 14.1 10*3/uL — ABNORMAL HIGH (ref 4.0–10.5)
nRBC: 0.4 % — ABNORMAL HIGH (ref 0.0–0.2)

## 2019-11-15 LAB — BASIC METABOLIC PANEL
Anion gap: 9 (ref 5–15)
BUN: 10 mg/dL (ref 6–20)
CO2: 23 mmol/L (ref 22–32)
Calcium: 8.6 mg/dL — ABNORMAL LOW (ref 8.9–10.3)
Chloride: 105 mmol/L (ref 98–111)
Creatinine, Ser: 0.78 mg/dL (ref 0.44–1.00)
GFR calc Af Amer: >60 mL/min (ref >60)
GFR calc non Af Amer: >60 mL/min (ref >60)
Glucose, Bld: 87 mg/dL (ref 70–99)
Potassium: 3.6 mmol/L (ref 3.5–5.1)
Sodium: 137 mmol/L (ref 135–145)

## 2019-11-15 LAB — ABO/RH: ABO/RH(D): O POS

## 2019-11-15 LAB — FOLATE: Folate: 10.1 ng/mL (ref >5.9)

## 2019-11-15 LAB — IRON AND TIBC
Iron: 13 ug/dL — ABNORMAL LOW (ref 28–170)
Saturation Ratios: 2 % — ABNORMAL LOW (ref 10.4–31.8)
TIBC: 528 ug/dL — ABNORMAL HIGH (ref 250–450)
UIBC: 515 ug/dL

## 2019-11-15 LAB — RETICULOCYTES
Immature Retic Fract: 33.3 % — ABNORMAL HIGH (ref 2.3–15.9)
RBC.: 3.62 MIL/uL — ABNORMAL LOW (ref 3.87–5.11)
Retic Count, Absolute: 97 10*3/uL (ref 19.0–186.0)
Retic Ct Pct: 2.7 % (ref 0.4–3.1)

## 2019-11-15 LAB — VITAMIN B12: Vitamin B-12: 453 pg/mL (ref 180–914)

## 2019-11-15 LAB — FERRITIN: Ferritin: 1 ng/mL — ABNORMAL LOW (ref 11–307)

## 2019-11-15 LAB — HEMOGLOBIN AND HEMATOCRIT, BLOOD
HCT: 21.4 % — ABNORMAL LOW (ref 36.0–46.0)
Hemoglobin: 5.7 g/dL — CL (ref 12.0–15.0)

## 2019-11-15 LAB — PREPARE RBC (CROSSMATCH)

## 2019-11-15 LAB — SARS CORONAVIRUS 2 (TAT 6-24 HRS): SARS Coronavirus 2: NEGATIVE

## 2019-11-15 LAB — HIV ANTIBODY (ROUTINE TESTING W REFLEX): HIV Screen 4th Generation wRfx: NONREACTIVE

## 2019-11-15 MED ORDER — SODIUM CHLORIDE 0.9 % IV SOLN
510.0000 mg | Freq: Once | INTRAVENOUS | Status: AC
  Administered 2019-11-15: 510 mg via INTRAVENOUS
  Filled 2019-11-15: qty 17

## 2019-11-15 MED ORDER — DOCUSATE SODIUM 100 MG PO CAPS: 100.0000 mg | ORAL_CAPSULE | Freq: Two times a day (BID) | ORAL | 0 refills | Status: AC | PRN

## 2019-11-15 MED ORDER — SODIUM CHLORIDE 0.9% FLUSH: 3.0000 mL | INTRAVENOUS | Status: DC | PRN

## 2019-11-15 MED ORDER — SODIUM CHLORIDE 0.9 % IV SOLN: 250.0000 mL | INTRAVENOUS | Status: DC | PRN

## 2019-11-15 MED ORDER — FERROUS SULFATE 325 (65 FE) MG PO TBEC: 325.0000 mg | DELAYED_RELEASE_TABLET | Freq: Two times a day (BID) | ORAL | 0 refills | Status: DC

## 2019-11-15 MED ORDER — SODIUM CHLORIDE 0.9% FLUSH
3.0000 mL | Freq: Two times a day (BID) | INTRAVENOUS | Status: DC
  Administered 2019-11-15: 3 mL via INTRAVENOUS

## 2019-11-15 MED ORDER — MEGESTROL ACETATE 40 MG PO TABS
40.0000 mg | ORAL_TABLET | Freq: Every day | ORAL | Status: DC
  Administered 2019-11-15: 40 mg via ORAL
  Filled 2019-11-15: qty 1

## 2019-11-15 MED ORDER — ONDANSETRON HCL 4 MG/2ML IJ SOLN: 4.0000 mg | Freq: Four times a day (QID) | INTRAMUSCULAR | Status: DC | PRN

## 2019-11-15 MED ORDER — ACETAMINOPHEN 650 MG RE SUPP: 650.0000 mg | Freq: Four times a day (QID) | RECTAL | Status: DC | PRN

## 2019-11-15 MED ORDER — MEGESTROL ACETATE 40 MG PO TABS
40.0000 mg | ORAL_TABLET | Freq: Two times a day (BID) | ORAL | Status: DC
  Administered 2019-11-15: 09:00:00 40 mg via ORAL
  Filled 2019-11-15: qty 1

## 2019-11-15 MED ORDER — ACETAMINOPHEN 325 MG PO TABS: 650.0000 mg | ORAL_TABLET | Freq: Four times a day (QID) | ORAL | Status: DC | PRN

## 2019-11-15 MED ORDER — SODIUM CHLORIDE 0.9 % IV BOLUS
500.0000 mL | Freq: Once | INTRAVENOUS | Status: AC
  Administered 2019-11-15: 500 mL via INTRAVENOUS

## 2019-11-15 MED ORDER — SODIUM CHLORIDE 0.9% IV SOLUTION: Freq: Once | INTRAVENOUS | Status: AC

## 2019-11-15 MED ORDER — MEGESTROL ACETATE 40 MG PO TABS: 40.0000 mg | ORAL_TABLET | Freq: Two times a day (BID) | ORAL | 0 refills | Status: AC

## 2019-11-15 MED ORDER — ONDANSETRON HCL 4 MG PO TABS: 4.0000 mg | ORAL_TABLET | Freq: Four times a day (QID) | ORAL | Status: DC | PRN

## 2019-11-15 NOTE — ED Notes (Signed)
Per attending MD patient will be discharged after blood transfusion and Feraheme are finished depending on new blood work drawn after.

## 2019-11-15 NOTE — ED Notes (Signed)
Patient called out to void. Patient ambulated to restroom with no assist.  Patient repositioned back in bed with no problems noted. Call bell within reach.

## 2019-11-15 NOTE — H&P (Signed)
History and Physical    Dorothy Whitaker NID:[id number] DOB: [birth year] DOA: 11/14/2019  PCP: Eustaquio Maize, MD (Inactive)   Patient coming from: Home   Chief Complaint: Fatigue, nausea   HPI: Dorothy Whitaker is a 24 y.o. female with medical history significant for asthma and heavy menstrual bleeding, now presenting to the emergency department for evaluation of fatigue and nausea.  Patient reports long history of irregular menstrual periods and heavy bleeding, reports heavy daily bleeding for the past 3 weeks or so and has developed progressive fatigue and nausea.  She denies any abdominal pain, melena, or hematochezia.  She uses albuterol as needed but does not take any other medications and denies any significant medical history other than asthma.  She denies chest pain or headache.  ED Course: Upon arrival to the ED, patient is found to be afebrile, saturating well on room air, tachycardic in the 120s, and with stable blood pressure.  EKG features sinus tachycardia.  Chemistry panel is unremarkable.  CBC notable for hemoglobin of 6.1 with MCV of 58.7.  ED physician discussed the case with OB/GYN who recommended starting Megace and having the patient follow-up after discharge.  Anemia panel was ordered, Covid PCR screening test is pending, 500 cc normal saline was given, patient was started on Megace, and 2 units of packed red blood cells were ordered.  Hospitalists asked to admit.  Review of Systems:  All other systems reviewed and apart from HPI, are negative.  Past Medical History:  Diagnosis Date  . Asthma     History reviewed. No pertinent surgical history.   reports that she has never smoked. She has never used smokeless tobacco. She reports that she does not drink alcohol or use drugs.  No Known Allergies  Family History  Problem Relation Age of Onset  . Diabetes Mother      Prior to Admission medications   Not on File    Physical Exam: Vitals:   11/15/19 0032  11/15/19 0218 11/15/19 0223 11/15/19 0238  BP: 137/80 (!) 156/79  136/87  Pulse: (!) 111 (!) 112  (!) 109  Resp: 10 15  16   Temp:  97.9 F (36.6 C)  98.1 F (36.7 C)  TempSrc:   Oral Oral  SpO2: 100% 100%      Constitutional: NAD, calm  Eyes: PERTLA, lids and conjunctivae normal ENMT: Mucous membranes are moist. Posterior pharynx clear of any exudate or lesions.   Neck: normal, supple, no masses, no thyromegaly Respiratory: clear to auscultation bilaterally, no wheezing, no crackles. Normal respiratory effort.   Cardiovascular: Rate ~110 and regular. No extremity edema.   Abdomen: No distension, no tenderness, soft. Bowel sounds active.  Musculoskeletal: no clubbing / cyanosis. No joint deformity upper and lower extremities.  Skin: no significant rashes, lesions, ulcers. Warm, dry, well-perfused. Neurologic: No facial asymmetry. Sensation intact. Moving all extremities.  Psychiatric: Alert and answering questions appropriately. Calm, cooperative.      Labs on Admission: I have personally reviewed following labs and imaging studies  CBC: Recent Labs  Lab 11/14/19 2239 11/15/19 0028  WBC 11.1*  --   HGB 6.1* 5.7*  HCT 23.2* 21.4*  MCV 58.7*  --   PLT 380  --    Basic Metabolic Panel: Recent Labs  Lab 11/14/19 2239  NA 136  K 3.9  CL 101  CO2 24  GLUCOSE 105*  BUN 10  CREATININE 0.82  CALCIUM 9.3   GFR: CrCl cannot be calculated (Unknown ideal weight.).  Liver Function Tests: Recent Labs  Lab 11/14/19 2239  AST 15  ALT 16  ALKPHOS 111  BILITOT 1.0  PROT 8.6*  ALBUMIN 4.5   Recent Labs  Lab 11/14/19 2239  LIPASE 22   No results for input(s): AMMONIA in the last 168 hours. Coagulation Profile: No results for input(s): INR, PROTIME in the last 168 hours. Cardiac Enzymes: No results for input(s): CKTOTAL, CKMB, CKMBINDEX, TROPONINI in the last 168 hours. BNP (last 3 results) No results for input(s): PROBNP in the last 8760 hours. HbA1C: No results  for input(s): HGBA1C in the last 72 hours. CBG: No results for input(s): GLUCAP in the last 168 hours. Lipid Profile: No results for input(s): CHOL, HDL, LDLCALC, TRIG, CHOLHDL, LDLDIRECT in the last 72 hours. Thyroid Function Tests: No results for input(s): TSH, T4TOTAL, FREET4, T3FREE, THYROIDAB in the last 72 hours. Anemia Panel: Recent Labs    11/15/19 0027  RETICCTPCT 2.7   Urine analysis: No results found for: COLORURINE, APPEARANCEUR, LABSPEC, PHURINE, GLUCOSEU, HGBUR, BILIRUBINUR, KETONESUR, PROTEINUR, UROBILINOGEN, NITRITE, LEUKOCYTESUR Sepsis Labs: @LABRCNTIP (procalcitonin:4,lacticidven:4) )No results found for this or any previous visit (from the past 240 hour(s)).   Radiological Exams on Admission: No results found.  EKG: Independently reviewed. Sinus tachycardia, rate 109.   Assessment/Plan   1. Symptomatic anemia  - Patient reports history of menorrhagia and presents with insidiously worsening fatigue and nausea, found to have Hgb of 6.1 with MCV 59  - 2 units of RBC ordered from ED and first unit infusing  - OBGYN recommended megace and outpatient follow-up  - Check post-transfusion CBC, continue megace     DVT prophylaxis: SCD's  Code Status: Full  Family Communication: Discussed with patient  Consults called: None  Admission status: Observation     , MD [facility] Pager [phone]  If 7PM-7AM, please contact night-coverage [email or web address] Password [facility]  11/15/2019, 2:42 AM

## 2019-11-15 NOTE — ED Notes (Signed)
Per pharmacy it is ok to give [facility] after blood transfusion without waiting 30 minutes and ok to give with primary line.

## 2019-11-15 NOTE — Discharge Instructions (Signed)
Anemia  Anemia is a condition in which you do not have enough red blood cells or hemoglobin. Hemoglobin is a substance in red blood cells that carries oxygen. When you do not have enough red blood cells or hemoglobin (are anemic), your body cannot get enough oxygen and your organs may not work properly. As a result, you may feel very tired or have other problems. What are the causes? Common causes of anemia include:  Excessive bleeding. Anemia can be caused by excessive bleeding inside or outside the body, including bleeding from the intestine or from periods in women.  Poor nutrition.  Long-lasting (chronic) kidney, thyroid, and liver disease.  Bone marrow disorders.  Cancer and treatments for cancer.  HIV (human immunodeficiency virus) and AIDS (acquired immunodeficiency syndrome).  Treatments for HIV and AIDS.  Spleen problems.  Blood disorders.  Infections, medicines, and autoimmune disorders that destroy red blood cells. What are the signs or symptoms? Symptoms of this condition include:  Minor weakness.  Dizziness.  Headache.  Feeling heartbeats that are irregular or faster than normal (palpitations).  Shortness of breath, especially with exercise.  Paleness.  Cold sensitivity.  Indigestion.  Nausea.  Difficulty sleeping.  Difficulty concentrating. Symptoms may occur suddenly or develop slowly. If your anemia is mild, you may not have symptoms. How is this diagnosed? This condition is diagnosed based on:  Blood tests.  Your medical history.  A physical exam.  Bone marrow biopsy. Your health care provider may also check your stool (feces) for blood and may do additional testing to look for the cause of your bleeding. You may also have other tests, including:  Imaging tests, such as a CT scan or MRI.  Endoscopy.  Colonoscopy. How is this treated? Treatment for this condition depends on the cause. If you continue to lose a lot of blood, you may  need to be treated at a hospital. Treatment may include:  Taking supplements of iron, vitamin S31, or folic acid.  Taking a hormone medicine (erythropoietin) that can help to stimulate red blood cell growth.  Having a blood transfusion. This may be needed if you lose a lot of blood.  Making changes to your diet.  Having surgery to remove your spleen. Follow these instructions at home:  Take over-the-counter and prescription medicines only as told by your health care provider.  Take supplements only as told by your health care provider.  Follow any diet instructions that you were given.  Keep all follow-up visits as told by your health care provider. This is important. Contact a health care provider if:  You develop new bleeding anywhere in the body. Get help right away if:  You are very weak.  You are short of breath.  You have pain in your abdomen or chest.  You are dizzy or feel faint.  You have trouble concentrating.  You have bloody or black, tarry stools.  You vomit repeatedly or you vomit up blood. Summary  Anemia is a condition in which you do not have enough red blood cells or enough of a substance in your red blood cells that carries oxygen (hemoglobin).  Symptoms may occur suddenly or develop slowly.  If your anemia is mild, you may not have symptoms.  This condition is diagnosed with blood tests as well as a medical history and physical exam. Other tests may be needed.  Treatment for this condition depends on the cause of the anemia. This information is not intended to replace advice given to you by  your health care provider. Make sure you discuss any questions you have with your health care provider. Document Revised: 10/02/2017 Document Reviewed: 11/21/2016 Elsevier Patient Education  [place].   Abnormal Uterine Bleeding Abnormal uterine bleeding means bleeding more than usual from your uterus. It can include:  Bleeding between  periods.  Bleeding after sex.  Bleeding that is heavier than normal.  Periods that last longer than usual.  Bleeding after you have stopped having your period (menopause). There are many problems that may cause this. You should see a doctor for any kind of bleeding that is not normal. Treatment depends on the cause of the bleeding. Follow these instructions at home:  Watch your condition for any changes.  Do not use tampons, douche, or have sex, if your doctor tells you not to.  Change your pads often.  Get regular well-woman exams. Make sure they include a pelvic exam and cervical cancer screening.  Keep all follow-up visits as told by your doctor. This is important. Contact a doctor if:  The bleeding lasts more than one week.  You feel dizzy at times.  You feel like you are going to throw up (nauseous).  You throw up. Get help right away if:  You pass out.  You have to change pads every hour.  You have belly (abdominal) pain.  You have a fever.  You get sweaty.  You get weak.  You passing large blood clots from your vagina. Summary  Abnormal uterine bleeding means bleeding more than usual from your uterus.  There are many problems that may cause this. You should see a doctor for any kind of bleeding that is not normal.  Treatment depends on the cause of the bleeding. This information is not intended to replace advice given to you by your health care provider. Make sure you discuss any questions you have with your health care provider. Document Revised: 10/14/2016 Document Reviewed: 10/14/2016 Elsevier Patient Education  2020 [employer].

## 2019-11-15 NOTE — ED Notes (Signed)
CBC drawn and sent to lab

## 2019-11-15 NOTE — ED Notes (Signed)
Dr. [place] aware patients repeat CBC shows hemoglobin 8.5. Per Dr. [place] patient cleared for discharge. An After Visit Summary was printed and given to the patient. Discharge instructions given and no further questions at this time. Pt A&Ox4 and ambulatory.

## 2019-11-15 NOTE — ED Provider Notes (Signed)
[facility]-EMERGENCY DEPT Provider Note   CSN: [id number] Arrival date & time: 11/14/19  2222     History Chief Complaint  Patient presents with  . Menstrual Problem  . Abdominal Pain    Dorothy Whitaker is a 24 y.o. female history of asthma, obesity presents today for vaginal bleeding x3 weeks.  Patient reports that she did not have her menstrual cycle in November, her current cycle started 21 days ago today.  She has had daily mild pelvic cramps consistent with her menstrual cycle, nonradiating no aggravating or alleviating factors with heavy vaginal bleeding and clots.  She reports that this feels like her normal menstrual cycle but she has never had one last this long before, normally her cycles only last 1 week.  She reports that over the past 1 week she has been feeling fatigued, sleeping more often.  Additionally reports nausea without vomiting over the last 2 days.  Denies fever/chills, fall/injury, headache, vision change, chest pain/shortness of breath, cough/hemoptysis, dyspnea with exertion, swelling/color change, vaginal discharge, dysuria/hematuria, concern for STI, melena, blood in the stool or any additional concerns.  Of note patient reports that she is attempting to establish OB/GYN care with [facility], unable to obtain an appointment until February. HPI     Past Medical History:  Diagnosis Date  . Asthma     Patient Active Problem List   Diagnosis Date Noted  . Symptomatic anemia 11/15/2019  . Acanthosis nigricans 09/21/2015  . Body mass index (BMI) of 36.0-36.9 in adult 07/17/2015    History reviewed. No pertinent surgical history.   OB History   No obstetric history on file.     History reviewed. No pertinent family history.  Social History   Tobacco Use  . Smoking status: Never Smoker  . Smokeless tobacco: Never Used  Substance Use Topics  . Alcohol use: No  . Drug use: No    Home Medications Prior to Admission  medications   Not on File    Allergies    Patient has no known allergies.  Review of Systems   Review of Systems Ten systems are reviewed and are negative for acute change except as noted in the HPI  Physical Exam Updated Vital Signs BP 137/80   Pulse (!) 111   Temp 98.9 F (37.2 C) (Oral)   Resp 10   LMP  (LMP Unknown)   SpO2 100%   Physical Exam Constitutional:      General: She is not in acute distress.    Appearance: Normal appearance. She is well-developed. She is obese. She is not ill-appearing or diaphoretic.  HENT:     Head: Normocephalic and atraumatic.     Right Ear: External ear normal.     Left Ear: External ear normal.     Nose: Nose normal.  Eyes:     General: Vision grossly intact. Gaze aligned appropriately.     Pupils: Pupils are equal, round, and reactive to light.  Neck:     Trachea: Trachea and phonation normal. No tracheal deviation.  Cardiovascular:     Rate and Rhythm: Regular rhythm. Tachycardia present.     Heart sounds: Normal heart sounds.  Pulmonary:     Effort: Pulmonary effort is normal. No respiratory distress.     Breath sounds: Normal breath sounds.  Abdominal:     General: There is no distension.     Palpations: Abdomen is soft.     Tenderness: There is no abdominal tenderness. There is  no guarding or rebound.  Genitourinary:    Comments: Deferred by patient Musculoskeletal:        General: Normal range of motion.     Cervical back: Normal range of motion.  Skin:    General: Skin is warm and dry.  Neurological:     Mental Status: She is alert.     GCS: GCS eye subscore is 4. GCS verbal subscore is 5. GCS motor subscore is 6.     Comments: Speech is clear and goal oriented, follows commands Major Cranial nerves without deficit, no facial droop Moves extremities without ataxia, coordination intact  Psychiatric:        Behavior: Behavior normal.     ED Results / Procedures / Treatments   Labs (all labs ordered are listed,  but only abnormal results are displayed) Labs Reviewed  COMPREHENSIVE METABOLIC PANEL - Abnormal; Notable for the following components:      Result Value   Glucose, Bld 105 (*)    Total Protein 8.6 (*)    All other components within normal limits  CBC - Abnormal; Notable for the following components:   WBC 11.1 (*)    Hemoglobin 6.1 (*)    HCT 23.2 (*)    MCV 58.7 (*)    MCH 15.4 (*)    MCHC 26.3 (*)    RDW 21.0 (*)    nRBC 0.5 (*)    All other components within normal limits  SARS CORONAVIRUS 2 (TAT 6-24 HRS)  LIPASE, BLOOD  URINALYSIS, ROUTINE W REFLEX MICROSCOPIC  VITAMIN B12  FOLATE  IRON AND TIBC  FERRITIN  RETICULOCYTES  HEMOGLOBIN AND HEMATOCRIT, BLOOD  I-STAT BETA HCG BLOOD, ED ([facility], [facility], [facility] ONLY)  TYPE AND SCREEN  PREPARE RBC (CROSSMATCH)  ABO/RH    EKG None  Radiology No results found.  Procedures .Critical Care Performed by: Deliah Boston, PA-C Authorized by: Deliah Boston, PA-C   Critical care provider statement:    Critical care time (minutes):  38   Critical care was necessary to treat or prevent imminent or life-threatening deterioration of the following conditions: Symptomatic anemia.   Critical care was time spent personally by me on the following activities:  Discussions with consultants, evaluation of patient's response to treatment, examination of patient, ordering and performing treatments and interventions, ordering and review of laboratory studies, ordering and review of radiographic studies, pulse oximetry, re-evaluation of patient's condition, obtaining history from patient or surrogate, review of old charts and development of treatment plan with patient or surrogate   (including critical care time)  Medications Ordered in ED Medications  sodium chloride flush (NS) 0.9 % injection 3 mL (has no administration in time range)  megestrol (MEGACE) tablet 40 mg (has no administration in time range)  sodium chloride 0.9 % bolus 500 mL  (500 mLs Intravenous New Bag/Given 11/15/19 0043)  0.9 %  sodium chloride infusion (Manually program via Guardrails IV Fluids) ( Intravenous New Bag/Given 11/15/19 0125)    ED Course  I have reviewed the triage vital signs and the nursing notes.  Pertinent labs & imaging results that were available during my care of the patient were reviewed by me and considered in my medical decision making (see chart for details).  Clinical Course as of Nov 14 125  Tue Nov 15, 2019  3976 Megace 40mg  bid   [BM]  0113 Dr. Myna Hidalgo   [BM]    Clinical Course User Index [BM] Gari Crown   MDM Rules/Calculators/A&P  Triage blood work: Beta-hCG negative CMP nonacute, glucose 105, protein 8.6, BUN and creatinine within normal limits Lipase within normal limits CBC hemoglobin 6.1, WBC 65.61 - 24 year old female arrives today after 3 weeks of vaginal bleeding.  Hemoglobin 6.1 on arrival, patient reports increased fatigue over the past week.  She denies any chest pain or shortness of breath.  She deferred pelvic examination today.  Concern for symptomatic anemia, anemia panel ordered in addition to type and screen.  Suspect mild tachycardia secondary to anemia due to menstrual bleeding.  As patient denies hematemesis, melena, blood in the stool and has normal BUN suspicion for GI bleed as cause of anemia today. - 12:44 AM: Discussed case with OB/GYN Dr. Debroah Loop who advises Megace 40 mg twice daily, transfusion and admission. - Patient reassessed resting comfortably no acute distress states understanding of care plan she is agreeable for transfusion and admission has no further questions or concerns at this time. - 1:13 AM: Discussed case with hospitalist Dr. Antionette Char who will be seeing patient for admission.  Case was discussed with Dr. Lissa Hoard during this visit.  Note: Portions of this report may have been transcribed using voice recognition software. Every effort was made to ensure  accuracy; however, inadvertent computerized transcription errors may still be present. Final Clinical Impression(s) / ED Diagnoses Final diagnoses:  Symptomatic anemia  Vaginal bleeding    Rx / DC Orders ED Discharge Orders    None       Elizabeth Palau 11/15/19 0127    Gilda Crease, MD 11/15/19 [phone]

## 2019-11-15 NOTE — Discharge Summary (Signed)
Physician Discharge Summary  Dorothy Whitaker UYQ:[id number] DOB: 17-Feb-1996 DOA: 11/14/2019  PCP: Eustaquio Maize, MD (Inactive)  Admit date: 11/14/2019 Discharge date: 11/15/2019  Admitted From: Home Disposition: Home  Recommendations for Outpatient Follow-up:  1. Follow up with PCP in 1 week 2. Follow-up with OB/GYN in 1 week 3. Tylenol ferrous sulfate 3 and 25 mg p.o. twice daily, Colace as needed for constipation 4. Megace 40 mg p.o. twice daily for heavy menstrual bleeding leading to symptomatic anemia/iron deficiency anemia 5. Please obtain CBC in one week  Home Health: No Equipment/Devices: None  Discharge Condition: Stable CODE STATUS: Full code Diet recommendation: Regular diet  History of present illness:  Dorothy Whitaker is a 24 y.o. female with medical history significant for asthma and heavy menstrual bleeding, now presenting to the emergency department for evaluation of fatigue and nausea.  Patient reports long history of irregular menstrual periods and heavy bleeding, reports heavy daily bleeding for the past 3 weeks or so and has developed progressive fatigue and nausea.  She denies any abdominal pain, melena, or hematochezia.  She uses albuterol as needed but does not take any other medications and denies any significant medical history other than asthma.  She denies chest pain or headache.  ED Course: Upon arrival to the ED, patient is found to be afebrile, saturating well on room air, tachycardic in the 120s, and with stable blood pressure.  EKG features sinus tachycardia.  Chemistry panel is unremarkable.  CBC notable for hemoglobin of 6.1 with MCV of 58.7.  ED physician discussed the case with OB/GYN who recommended starting Megace and having the patient follow-up after discharge.  Anemia panel was ordered, Covid PCR screening test is pending, 500 cc normal saline was given, patient was started on Megace, and 2 units of packed red blood cells were ordered.   Hospitalists asked to admit.  Hospital course:  Symptomatic iron deficiency anemia Heavy menstrual bleeding/abnormal uterine bleeding Patient presented to the ED with progressive fatigue, weakness and reports of heavy menstrual bleeding.  Patient was found to have a hemoglobin of 5.7 with a low MCV of 58.7.  hCG less than 5.0.  ED physician discussed case with on-call OB/GYN, Dr. Roselie Awkward who recommended transfusion and initiation of Megace with outpatient follow-up.  Patient was transfused 2 units PRBCs with adequate rise in hemoglobin to 8.5.  Iron studies were notable for iron 13, TIBC 528, ferritin 1, folate 10.1; and received IV Feraheme.  Will discharge home on Megace 40 mg p.o. twice daily, ferrous sulfate 325 mg p.o. twice daily, and Colace as needed for constipation.  Recommend follow-up with PCP in 1 week with repeat CBC.  Currently attempting to establish care with [facility], recommend appointment in 1-2 weeks.  Discharge Diagnoses:  Principal Problem:   Symptomatic anemia Active Problems:   Heavy menstrual bleeding    Discharge Instructions  Discharge Instructions    Call MD for:  difficulty breathing, headache or visual disturbances   Complete by: As directed    Call MD for:  extreme fatigue   Complete by: As directed    Call MD for:  persistant dizziness or light-headedness   Complete by: As directed    Call MD for:  persistant nausea and vomiting   Complete by: As directed    Call MD for:  severe uncontrolled pain   Complete by: As directed    Call MD for:  temperature >100.4   Complete by: As directed    Diet -  low sodium heart healthy   Complete by: As directed    Increase activity slowly   Complete by: As directed      Allergies as of 11/15/2019   No Known Allergies     Medication List    TAKE these medications   docusate sodium 100 MG capsule Commonly known as: Colace Take 1 capsule (100 mg total) by mouth 2 (two) times daily as needed for mild  constipation.   ferrous sulfate 325 (65 FE) MG EC tablet Take 1 tablet (325 mg total) by mouth 2 (two) times daily.   megestrol 40 MG tablet Commonly known as: MEGACE Take 1 tablet (40 mg total) by mouth 2 (two) times daily.      Follow-up Information    Johna Sheriff, MD. Schedule an appointment as soon as possible for a visit in 1 week(s).   Specialty: Pediatrics Contact information: [address] [place] [address] [phone]        Obgyn, Ma Hillock. Schedule an appointment as soon as possible for a visit in 1 week(s).   Contact information: [address] [place] [address] [phone]          No Known Allergies  Consultations:  GYN - Dr. Debroah Loop; case was discussed by ED provider with recommendations of initiation of Megace and transfusion outpatient follow-up   Procedures/Studies:  No results found.   Subjective: Patient seen and examined bedside, sitting in the ED holding area.  Currently receiving her last transfusion of PRBCs.  Patient states feels remarkably better with less fatigue and weakness.  Requesting discharge home following transfusion and IV iron administration.  No other complaints or concerns at this time.  Denies headache, no fever/chills/night sweats, no nausea/vomiting/diarrhea, no chest pain, no palpitations, no shortness of breath, no abdominal pain, no paresthesias.  No acute events overnight per nursing staff.  Discharge Exam: Vitals:   11/15/19 1030 11/15/19 1100  BP: 128/78 127/76  Pulse: 93 95  Resp: 18 (!) 27  Temp:    SpO2: 99% 97%   Vitals:   11/15/19 0930 11/15/19 1000 11/15/19 1030 11/15/19 1100  BP: 133/85 128/83 128/78 127/76  Pulse: (!) 106 92 93 95  Resp: 19 (!) 24 18 (!) 27  Temp:      TempSrc:      SpO2: 100% 99% 99% 97%    General: Pt is alert, awake, not in acute distress Cardiovascular: RRR, S1/S2 +, no rubs, no gallops Respiratory: CTA bilaterally, no wheezing, no rhonchi Abdominal: Soft,  NT, ND, bowel sounds + Extremities: no edema, no cyanosis    The results of significant diagnostics from this hospitalization (including imaging, microbiology, ancillary and laboratory) are listed below for reference.     Microbiology: Recent Results (from the past 240 hour(s))  SARS CORONAVIRUS 2 (TAT 6-24 HRS) Nasopharyngeal Nasopharyngeal Swab     Status: None   Collection Time: 11/15/19 12:54 AM   Specimen: Nasopharyngeal Swab  Result Value Ref Range Status   SARS Coronavirus 2 NEGATIVE NEGATIVE Final    Comment: (NOTE) SARS-CoV-2 target nucleic acids are NOT DETECTED. The SARS-CoV-2 RNA is generally detectable in upper and lower respiratory specimens during the acute phase of infection. Negative results do not preclude SARS-CoV-2 infection, do not rule out co-infections with other pathogens, and should not be used as the sole basis for treatment or other patient management decisions. Negative results must be combined with clinical observations, patient history, and epidemiological information. The expected result is Negative. Fact Sheet for  Patients: HairSlick.no Fact Sheet for Healthcare Providers: quierodirigir.com This test is not yet approved or cleared by the [place] FDA and  has been authorized for detection and/or diagnosis of SARS-CoV-2 by FDA under an Emergency Use Authorization (EUA). This EUA will remain  in effect (meaning this test can be used) for the duration of the COVID-19 declaration under Section 56 4(b)(1) of the Act, 21 U.S.C. section 360bbb-3(b)(1), unless the authorization is terminated or revoked sooner. Performed at [facility], [address], [place], [place] [address]      Labs: BNP (last 3 results) No results for input(s): BNP in the last 8760 hours. Basic Metabolic Panel: Recent Labs  Lab 11/14/19 2239 11/15/19 0500  NA 136 137  K 3.9 3.6  CL 101 105  CO2 24 23   GLUCOSE 105* 87  BUN 10 10  CREATININE 0.82 0.78  CALCIUM 9.3 8.6*   Liver Function Tests: Recent Labs  Lab 11/14/19 2239  AST 15  ALT 16  ALKPHOS 111  BILITOT 1.0  PROT 8.6*  ALBUMIN 4.5   Recent Labs  Lab 11/14/19 2239  LIPASE 22   No results for input(s): AMMONIA in the last 168 hours. CBC: Recent Labs  Lab 11/14/19 2239 11/15/19 0028 11/15/19 1047  WBC 11.1*  --  14.1*  HGB 6.1* 5.7* 8.5*  HCT 23.2* 21.4* 28.4*  MCV 58.7*  --  65.9*  PLT 380  --  317   Cardiac Enzymes: No results for input(s): CKTOTAL, CKMB, CKMBINDEX, TROPONINI in the last 168 hours. BNP: Invalid input(s): POCBNP CBG: No results for input(s): GLUCAP in the last 168 hours. D-Dimer No results for input(s): DDIMER in the last 72 hours. Hgb A1c No results for input(s): HGBA1C in the last 72 hours. Lipid Profile No results for input(s): CHOL, HDL, LDLCALC, TRIG, CHOLHDL, LDLDIRECT in the last 72 hours. Thyroid function studies No results for input(s): TSH, T4TOTAL, T3FREE, THYROIDAB in the last 72 hours.  Invalid input(s): FREET3 Anemia work up Recent Labs    11/15/19 0027  VITAMINB12 453  FOLATE 10.1  FERRITIN 1*  TIBC 528*  IRON 13*  RETICCTPCT 2.7   Urinalysis No results found for: COLORURINE, APPEARANCEUR, LABSPEC, PHURINE, GLUCOSEU, HGBUR, BILIRUBINUR, KETONESUR, PROTEINUR, UROBILINOGEN, NITRITE, LEUKOCYTESUR Sepsis Labs Invalid input(s): PROCALCITONIN,  WBC,  LACTICIDVEN Microbiology Recent Results (from the past 240 hour(s))  SARS CORONAVIRUS 2 (TAT 6-24 HRS) Nasopharyngeal Nasopharyngeal Swab     Status: None   Collection Time: 11/15/19 12:54 AM   Specimen: Nasopharyngeal Swab  Result Value Ref Range Status   SARS Coronavirus 2 NEGATIVE NEGATIVE Final    Comment: (NOTE) SARS-CoV-2 target nucleic acids are NOT DETECTED. The SARS-CoV-2 RNA is generally detectable in upper and lower respiratory specimens during the acute phase of infection. Negative results do not  preclude SARS-CoV-2 infection, do not rule out co-infections with other pathogens, and should not be used as the sole basis for treatment or other patient management decisions. Negative results must be combined with clinical observations, patient history, and epidemiological information. The expected result is Negative. Fact Sheet for Patients: HairSlick.no Fact Sheet for Healthcare Providers: quierodirigir.com This test is not yet approved or cleared by the [place] FDA and  has been authorized for detection and/or diagnosis of SARS-CoV-2 by FDA under an Emergency Use Authorization (EUA). This EUA will remain  in effect (meaning this test can be used) for the duration of the COVID-19 declaration under Section 56 4(b)(1) of the Act, 21 U.S.C.  section 360bbb-3(b)(1), unless the authorization is terminated or revoked sooner. Performed at [facility], [address] [place], [place] [address]      Time coordinating discharge: Over 30 minutes  SIGNED:   Alvira Philips [place], DO  [facility] 11/15/2019, 11:23 AM

## 2019-11-16 LAB — TYPE AND SCREEN
ABO/RH(D): O POS
Antibody Screen: NEGATIVE
Unit division: 0
Unit division: 0

## 2019-11-16 LAB — BPAM RBC
Blood Product Expiration Date: 202102102359
Blood Product Expiration Date: 202102102359
ISSUE DATE / TIME: 202101120218
ISSUE DATE / TIME: 202101120531
Unit Type and Rh: 5100
Unit Type and Rh: 5100

## 2021-06-04 ENCOUNTER — Encounter: Payer: Self-pay | Admitting: Nurse Practitioner

## 2021-06-04 ENCOUNTER — Ambulatory Visit (INDEPENDENT_AMBULATORY_CARE_PROVIDER_SITE_OTHER): Payer: Self-pay | Admitting: Nurse Practitioner

## 2021-06-04 DIAGNOSIS — U071 COVID-19: Secondary | ICD-10-CM

## 2021-06-04 MED ORDER — MOLNUPIRAVIR EUA 200MG CAPSULE: 4.0000 | ORAL_CAPSULE | Freq: Two times a day (BID) | ORAL | 0 refills | Status: AC

## 2021-06-04 MED ORDER — ALBUTEROL SULFATE HFA 108 (90 BASE) MCG/ACT IN AERS: 2.0000 | INHALATION_SPRAY | Freq: Four times a day (QID) | RESPIRATORY_TRACT | 0 refills | Status: DC | PRN

## 2021-06-04 NOTE — Progress Notes (Signed)
Virtual Visit  Note Due to COVID-19 pandemic this visit was conducted virtually. This visit type was conducted due to national recommendations for restrictions regarding the COVID-19 Pandemic (e.g. social distancing, sheltering in place) in an effort to limit this patient's exposure and mitigate transmission in our community. All issues noted in this document were discussed and addressed.  A physical exam was not performed with this format.  I connected with [name] on 06/04/21 at 3:55 by telephone and verified that I am speaking with the correct person using two identifiers. KAIREE KOZMA is currently located in [place] and sister is currently with her during visit. The provider, Mary-Margaret Daphine Deutscher, FNP is located in their office at time of visit.  I discussed the limitations, risks, security and privacy concerns of performing an evaluation and management service by telephone and the availability of in person appointments. I also discussed with the patient that there may be a patient responsible charge related to this service. The patient expressed understanding and agreed to proceed.   History and Present Illness:   Chief Complaint: covid positive  HPI  Patient is on vacation in [place]. She started getting sick last night.Strated with a fever of 100 and cough and congestion developed shortly after. She has been taking dayquil and robitussin. Feels wore today then yesterday. Home test for covid positive this morning.   Review of Systems  Constitutional:  Positive for chills, fever and malaise/fatigue.  HENT:  Positive for congestion. Negative for sore throat.   Respiratory:  Positive for cough, sputum production and shortness of breath.   Musculoskeletal:  Positive for myalgias.  Neurological:  Positive for headaches.    Observations/Objective: Alert and oriented- answers all questions appropriately No distress Voice hoarse Deep cough  Assessment and  Plan: [name] in today with chief complaint of Covid Positive   1. Lab test positive for detection of COVID-19 virus 1. Take meds as prescribed 2. Use a cool mist humidifier especially during the winter months and when heat has been humid. 3. Use saline nose sprays frequently 4. Saline irrigations of the nose can be very helpful if done frequently.  * 4X daily for 1 week*  * Use of a nettie pot can be helpful with this. Follow directions with this* 5. Drink plenty of fluids 6. Keep thermostat turn down low 7.For any cough or congestion  Use plain Mucinex- regular strength or max strength is fine   * Children- consult with Pharmacist for dosing 8. For fever or aces or pains- take tylenol or ibuprofen appropriate for age and weight.  * for fevers greater than 101 orally you may alternate ibuprofen and tylenol every  3 hours.   Meds ordered this encounter  Medications   molnupiravir EUA 200 mg CAPS    Sig: Take 4 capsules (800 mg total) by mouth 2 (two) times daily for 5 days.    Dispense:  40 capsule    Refill:  0    Order Specific Question:   Supervising Provider    Answer:   Nils Pyle [[id number]]   Quarantine for 5days Medication side effects discussed    Follow Up Instructions: prn    I discussed the assessment and treatment plan with the patient. The patient was provided an opportunity to ask questions and all were answered. The patient agreed with the plan and demonstrated an understanding of the instructions.   The patient was advised to call back or seek an in-person evaluation if  the symptoms worsen or if the condition fails to improve as anticipated.  The above assessment and management plan was discussed with the patient. The patient verbalized understanding of and has agreed to the management plan. Patient is aware to call the clinic if symptoms persist or worsen. Patient is aware when to return to the clinic for a follow-up visit. Patient educated  on when it is appropriate to go to the emergency department.   Time call ended: 4:06  I provided 11 minutes of  non face-to-face time during this encounter.    Mary-Margaret Daphine Deutscher, FNP

## 2021-06-07 ENCOUNTER — Emergency Department (HOSPITAL_BASED_OUTPATIENT_CLINIC_OR_DEPARTMENT_OTHER)
Admission: EM | Admit: 2021-06-07 | Discharge: 2021-06-08 | Disposition: A | Discharge status: Home / Self Care | Payer: Self-pay | Attending: Emergency Medicine | Admitting: Emergency Medicine

## 2021-06-07 ENCOUNTER — Emergency Department (HOSPITAL_BASED_OUTPATIENT_CLINIC_OR_DEPARTMENT_OTHER): Payer: Self-pay

## 2021-06-07 ENCOUNTER — Other Ambulatory Visit: Payer: Self-pay

## 2021-06-07 ENCOUNTER — Encounter (HOSPITAL_BASED_OUTPATIENT_CLINIC_OR_DEPARTMENT_OTHER): Payer: Self-pay | Admitting: Emergency Medicine

## 2021-06-07 DIAGNOSIS — N9489 Other specified conditions associated with female genital organs and menstrual cycle: Secondary | ICD-10-CM | POA: Insufficient documentation

## 2021-06-07 DIAGNOSIS — F419 Anxiety disorder, unspecified: Secondary | ICD-10-CM | POA: Insufficient documentation

## 2021-06-07 DIAGNOSIS — R002 Palpitations: Secondary | ICD-10-CM

## 2021-06-07 DIAGNOSIS — J45909 Unspecified asthma, uncomplicated: Secondary | ICD-10-CM | POA: Insufficient documentation

## 2021-06-07 LAB — BASIC METABOLIC PANEL
Anion gap: 10 (ref 5–15)
BUN: 11 mg/dL (ref 6–20)
CO2: 26 mmol/L (ref 22–32)
Calcium: 9.6 mg/dL (ref 8.9–10.3)
Chloride: 102 mmol/L (ref 98–111)
Creatinine, Ser: 0.79 mg/dL (ref 0.44–1.00)
GFR, Estimated: >60 mL/min (ref >60)
Glucose, Bld: 83 mg/dL (ref 70–99)
Potassium: 3.9 mmol/L (ref 3.5–5.1)
Sodium: 138 mmol/L (ref 135–145)

## 2021-06-07 LAB — CBC WITH DIFFERENTIAL/PLATELET
Abs Immature Granulocytes: 0.05 10*3/uL (ref 0.00–0.07)
Basophils Absolute: 0.1 10*3/uL (ref 0.0–0.1)
Basophils Relative: 1 %
Eosinophils Absolute: 0 10*3/uL (ref 0.0–0.5)
Eosinophils Relative: 1 %
HCT: 44.7 % (ref 36.0–46.0)
Hemoglobin: 13.9 g/dL (ref 12.0–15.0)
Immature Granulocytes: 1 %
Lymphocytes Relative: 18 %
Lymphs Abs: 1.6 10*3/uL (ref 0.7–4.0)
MCH: 21.7 pg — ABNORMAL LOW (ref 26.0–34.0)
MCHC: 31.1 g/dL (ref 30.0–36.0)
MCV: 69.8 fL — ABNORMAL LOW (ref 80.0–100.0)
Monocytes Absolute: 0.6 10*3/uL (ref 0.1–1.0)
Monocytes Relative: 7 %
Neutro Abs: 6.3 10*3/uL (ref 1.7–7.7)
Neutrophils Relative %: 72 %
Platelets: 353 10*3/uL (ref 150–400)
RBC: 6.4 MIL/uL — ABNORMAL HIGH (ref 3.87–5.11)
RDW: 20.9 % — ABNORMAL HIGH (ref 11.5–15.5)
WBC: 8.6 10*3/uL (ref 4.0–10.5)
nRBC: 0 % (ref 0.0–0.2)

## 2021-06-07 LAB — HCG, SERUM, QUALITATIVE: Preg, Serum: NEGATIVE

## 2021-06-07 LAB — TROPONIN I (HIGH SENSITIVITY): Troponin I (High Sensitivity): 2 ng/L (ref <18)

## 2021-06-07 MED ORDER — HYDROXYZINE HCL 25 MG PO TABS
25.0000 mg | ORAL_TABLET | Freq: Once | ORAL | Status: AC
  Administered 2021-06-07: 25 mg via ORAL
  Filled 2021-06-07: qty 1

## 2021-06-07 MED ORDER — LORAZEPAM 1 MG PO TABS
0.5000 mg | ORAL_TABLET | Freq: Once | ORAL | Status: AC
  Administered 2021-06-07: 0.5 mg via ORAL
  Filled 2021-06-07: qty 1

## 2021-06-07 NOTE — Discharge Instructions (Addendum)
It was a pleasure taking care of you today. As discussed, all of your labs were reassuring today. I suspect your symptoms are related to anxiety. I am sending you home with anxiety medication.  Take as needed for anxiety.  Please follow-up with PCP early next week for further evaluation of anxiety.  Return to the ER for new or worsening symptoms.

## 2021-06-07 NOTE — ED Provider Notes (Signed)
[facility] EMERGENCY DEPARTMENT Provider Note   CSN: [id number] Arrival date & time: 06/07/21  2035     History Chief Complaint  Patient presents with   Palpitations    Dorothy Whitaker is a 25 y.o. female with a past medical history significant for anemia and asthma who presents to the ED due to possible panic attacks.  Patient states she has been having intermittent palpitations preceded by anxious thoughts.  Patient notes when the palpitations begin she becomes acutely short of breath with mild chest tightness. No history of blood clots, recent surgeries or recent long immobilizations, hormonal treatments.  No lower extremity edema.  No history of anxiety.  She is not currently on any medications. No thyroid issues. Patient was seen at [facility] and advised to present to the ED for further evaluation.  No treatment prior to arrival.  No aggravating or alleviating symptoms.  History obtained from patient and past medical records. No interpreter used during encounter.       Past Medical History:  Diagnosis Date   Asthma     Patient Active Problem List   Diagnosis Date Noted   Symptomatic anemia 11/15/2019   Heavy menstrual bleeding 11/15/2019   Asthma    Acanthosis nigricans 09/21/2015   Body mass index (BMI) of 36.0-36.9 in adult 07/17/2015    History reviewed. No pertinent surgical history.   OB History   No obstetric history on file.     Family History  Problem Relation Age of Onset   Diabetes Mother     Social History   Tobacco Use   Smoking status: Never   Smokeless tobacco: Never  Vaping Use   Vaping Use: Never used  Substance Use Topics   Alcohol use: No   Drug use: No    Home Medications Prior to Admission medications   Medication Sig Start Date End Date Taking? Authorizing Provider  albuterol (VENTOLIN HFA) 108 (90 Base) MCG/ACT inhaler Inhale 2 puffs into the lungs every 6 (six) hours as needed for wheezing or shortness of breath. 06/04/21    Daphine Deutscher Mary-Margaret, FNP  ferrous sulfate 325 (65 FE) MG EC tablet Take 1 tablet (325 mg total) by mouth 2 (two) times daily. 11/15/19 02/13/20  [place], Alvira Philips, DO  molnupiravir EUA 200 mg CAPS Take 4 capsules (800 mg total) by mouth 2 (two) times daily for 5 days. 06/04/21 06/09/21  Bennie Pierini, FNP    Allergies    Patient has no known allergies.  Review of Systems   Review of Systems  Respiratory:  Positive for shortness of breath.   Cardiovascular:  Positive for chest pain and palpitations. Negative for leg swelling.  Psychiatric/Behavioral:  The patient is nervous/anxious.   All other systems reviewed and are negative.  Physical Exam Updated Vital Signs BP (!) 141/91   Pulse 80   Temp 98.8 F (37.1 C) (Oral)   Resp 15   Ht 5\' 3"  (1.6 m)   Wt 102.1 kg   LMP 05/02/2021 (Approximate)   SpO2 96%   BMI 39.86 kg/m   Physical Exam Vitals and nursing note reviewed.  Constitutional:      General: She is not in acute distress.    Appearance: She is not ill-appearing.  HENT:     Head: Normocephalic.  Eyes:     Pupils: Pupils are equal, round, and reactive to light.  Cardiovascular:     Rate and Rhythm: Normal rate and regular rhythm.     Pulses: Normal pulses.  Heart sounds: Normal heart sounds. No murmur heard.   No friction rub. No gallop.  Pulmonary:     Effort: Pulmonary effort is normal.     Breath sounds: Normal breath sounds.  Abdominal:     General: Abdomen is flat. There is no distension.     Palpations: Abdomen is soft.     Tenderness: There is no abdominal tenderness. There is no guarding or rebound.  Musculoskeletal:        General: Normal range of motion.     Cervical back: Neck supple.     Comments: No lower extremity edema  Skin:    General: Skin is warm and dry.  Neurological:     General: No focal deficit present.     Mental Status: She is alert.  Psychiatric:        Mood and Affect: Mood is anxious.        Behavior: Behavior normal.     ED Results / Procedures / Treatments   Labs (all labs ordered are listed, but only abnormal results are displayed) Labs Reviewed  CBC WITH DIFFERENTIAL/PLATELET - Abnormal; Notable for the following components:      Result Value   RBC 6.40 (*)    MCV 69.8 (*)    MCH 21.7 (*)    RDW 20.9 (*)    All other components within normal limits  BASIC METABOLIC PANEL  HCG, SERUM, QUALITATIVE  TSH  TROPONIN I (HIGH SENSITIVITY)    EKG None  Radiology DG Chest Portable 1 View  Result Date: 06/07/2021 CLINICAL DATA:  Chest pain, COVID positive 06/04/2021 EXAM: PORTABLE CHEST 1 VIEW COMPARISON:  Radiograph 09/28/2018 FINDINGS: No consolidation, features of edema, pneumothorax, or effusion. Pulmonary vascularity is normally distributed. The cardiomediastinal contours are unremarkable. No acute osseous or soft tissue abnormality. IMPRESSION: No acute cardiopulmonary abnormality. Electronically Signed   By: Kreg Shropshire M.D.   On: 06/07/2021 22:46    Procedures Procedures   Medications Ordered in ED Medications  hydrOXYzine (ATARAX/VISTARIL) tablet 25 mg (25 mg Oral Given 06/07/21 2241)  LORazepam (ATIVAN) tablet 0.5 mg (0.5 mg Oral Given 06/07/21 2357)    ED Course  I have reviewed the triage vital signs and the nursing notes.  Pertinent labs & imaging results that were available during my care of the patient were reviewed by me and considered in my medical decision making (see chart for details).    MDM Rules/Calculators/A&P                          25 year old female presents to the ED due to palpitations associated with shortness of breath and chest pain.  Patient notes she feels acutely anxious during these episodes that have been intermittent for the past few weeks.  No cardiac history.  No history of blood clots, recent surgeries or recent long immobilizations, hormonal treatments.  Upon arrival, patient afebrile, not tachycardic or hypoxic.  Patient in no acute distress.  Physical  exam reassuring.  No lower extremity edema.  No clinical signs of DVT. Low suspicion for DVT/PE. Cardiac labs ordered.  Suspect underlying anxiety as possible etiology of palpitations.  Hydroxyzine given.  CBC reassuring with no leukocytosis and normal hemoglobin.  BMP unremarkable with normal renal function and no major electrolyte derangements.  Pregnancy test negative.  Chest x-ray personally reviewed which is negative for signs of pneumonia, pneumothorax or widened mediastinum.  EKG demonstrates normal sinus rhythm with no signs of acute ischemia.  Suspect underlying anxiety as etiology of palpitations.  Patient discharged with Vistaril as needed.  Advised patient follow-up with PCP early next week for further evaluation. Strict ED precautions discussed with patient. Patient states understanding and agrees to plan. Patient discharged home in no acute distress and stable vitals. Final Clinical Impression(s) / ED Diagnoses Final diagnoses:  Palpitations  Anxiety    Rx / DC Orders ED Discharge Orders     None        Jesusita Oka 06/07/21 2359    Arby Barrette, MD 07/06/21 [phone]

## 2021-06-07 NOTE — ED Triage Notes (Signed)
Patient arrived via EMS c/o palpitations w/ anxiety. Patient attempted to be seen at [facility] but they denied to see her. Patient states this has been happening on and off for several weeks. Patient is AO x 4, VS WDL, normal gait.

## 2021-06-08 LAB — TSH: TSH: 2.774 u[IU]/mL (ref 0.350–4.500)

## 2021-06-08 MED ORDER — HYDROXYZINE HCL 25 MG PO TABS: 25.0000 mg | ORAL_TABLET | Freq: Four times a day (QID) | ORAL | 0 refills | Status: DC | PRN

## 2021-06-16 ENCOUNTER — Other Ambulatory Visit: Payer: Self-pay

## 2021-06-16 ENCOUNTER — Encounter (HOSPITAL_BASED_OUTPATIENT_CLINIC_OR_DEPARTMENT_OTHER): Payer: Self-pay

## 2021-06-16 DIAGNOSIS — J45909 Unspecified asthma, uncomplicated: Secondary | ICD-10-CM | POA: Insufficient documentation

## 2021-06-16 DIAGNOSIS — R7309 Other abnormal glucose: Secondary | ICD-10-CM | POA: Insufficient documentation

## 2021-06-16 DIAGNOSIS — F419 Anxiety disorder, unspecified: Secondary | ICD-10-CM | POA: Insufficient documentation

## 2021-06-16 NOTE — ED Triage Notes (Signed)
Patient present with anxiety.  Patient unsure if its related to low iron or pre diabetes.

## 2021-06-17 ENCOUNTER — Emergency Department (HOSPITAL_BASED_OUTPATIENT_CLINIC_OR_DEPARTMENT_OTHER)
Admission: EM | Admit: 2021-06-17 | Discharge: 2021-06-17 | Disposition: A | Discharge status: Home / Self Care | Payer: Medicaid Other | Attending: Emergency Medicine | Admitting: Emergency Medicine

## 2021-06-17 DIAGNOSIS — F419 Anxiety disorder, unspecified: Secondary | ICD-10-CM

## 2021-06-17 LAB — CBG MONITORING, ED: Glucose-Capillary: 92 mg/dL (ref 70–99)

## 2021-06-17 NOTE — Discharge Instructions (Addendum)
You were evaluated in the Emergency Department and after careful evaluation, we did not find any emergent condition requiring admission or further testing in the hospital.  Your exam/testing today was overall reassuring.  Blood sugar and EKG were normal.  Recommend follow-up with your regular doctor and/or behavioral health to discuss your anxiety.  Please return to the Emergency Department if you experience any worsening of your condition.  Thank you for allowing [place] to be a part of your care.

## 2021-06-17 NOTE — ED Provider Notes (Signed)
[facility]EM[facility] DEPT [facility] Emergency Department Provider Note MRN:  [id number]  Arrival date & time: 06/17/21     Chief Complaint   Anxiety   History of Present Illness   Dorothy Whitaker is a 25 y.o. year-old female with a history of asthma presenting to the ED with chief complaint of anxiety.  Patient has been struggling with anxiety over the past month.  Endorsing occasional palpitations, jitteriness, chest tightness.  Denies shortness of breath.  No leg pain or swelling, no abdominal pain, no fever, no cough.  Wants to make sure that her symptoms are related to low iron or high blood sugar.  Thinks that her Google searches have made her more anxious.  Symptoms intermittent, mild, no exacerbating or alleviating factors.  Review of Systems  A complete 10 system review of systems was obtained and all systems are negative except as noted in the HPI and PMH.   Patient's Health History    Past Medical History:  Diagnosis Date   Asthma     History reviewed. No pertinent surgical history.  Family History  Problem Relation Age of Onset   Diabetes Mother     Social History   Socioeconomic History   Marital status: Single    Spouse name: Not on file   Number of children: Not on file   Years of education: Not on file   Highest education level: Not on file  Occupational History   Not on file  Tobacco Use   Smoking status: Never   Smokeless tobacco: Never  Vaping Use   Vaping Use: Never used  Substance and Sexual Activity   Alcohol use: No   Drug use: No   Sexual activity: Yes    Birth control/protection: Condom  Other Topics Concern   Not on file  Social History Narrative   Not on file   Social Determinants of Health   Financial Resource Strain: Not on file  Food Insecurity: Not on file  Transportation Needs: Not on file  Physical Activity: Not on file  Stress: Not on file  Social Connections: Not on file  Intimate Partner Violence: Not on file      Physical Exam   Vitals:   06/16/21 2355  BP: (!) 156/99  Pulse: (!) 106  Resp: 20  Temp: 98.7 F (37.1 C)  SpO2: 100%    CONSTITUTIONAL: Well-appearing, NAD NEURO:  Alert and oriented x 3, no focal deficits EYES:  eyes equal and reactive ENT/NECK:  no LAD, no JVD CARDIO: Regular rate, well-perfused, normal S1 and S2 PULM:  CTAB no wheezing or rhonchi GI/GU:  normal bowel sounds, non-distended, non-tender MSK/SPINE:  No gross deformities, no edema SKIN:  no rash, atraumatic PSYCH:  Appropriate speech and behavior  *Additional and/or pertinent findings included in MDM below  Diagnostic and Interventional Summary    EKG Interpretation  Date/Time:  Monday June 17 2021 01:02:20 EDT Ventricular Rate:  97 PR Interval:  189 QRS Duration: 86 QT Interval:  339 QTC Calculation: 431 R Axis:   20 Text Interpretation: Sinus rhythm Borderline T wave abnormalities Confirmed by Kennis Carina [id number]) on 06/17/2021 1:14:04 AM       Labs Reviewed  CBG MONITORING, ED    No orders to display    Medications - No data to display   Procedures  /  Critical Care Procedures  ED Course and Medical Decision Making  I have reviewed the triage vital signs, the nursing notes, and pertinent available records from the  EMR.  Listed above are laboratory and imaging tests that I personally ordered, reviewed, and interpreted and then considered in my medical decision making (see below for details).  Suspect anxiety, not getting much improvement with Atarax at home.  Had a work-up a few days ago that was very reassuring, normal labs, mild anemia with a low MCV and so she is advised to continue taking supplemental iron.  Troponin was negative.  X-ray was clear.  She is well-appearing today with normal vital signs.  She is anxious I suspect that is the cause of her mild tachycardia, which resolves once you are in the room with her for more than 5 minutes.  No evidence of DVT, highly doubt PE.  Will  recheck EKG and check blood sugar, if normal patient will be appropriate for discharge.  Advised follow-up with counselor or psychiatrist.       Elmer Sow. Pilar Plate, MD [facility] Emergency Medicine [facility] [email or web address]  Final Clinical Impressions(s) / ED Diagnoses     ICD-10-CM   1. Anxiety  F41.9       ED Discharge Orders     None        Discharge Instructions Discussed with and Provided to Patient:     Discharge Instructions      You were evaluated in the Emergency Department and after careful evaluation, we did not find any emergent condition requiring admission or further testing in the hospital.  Your exam/testing today was overall reassuring.  Blood sugar and EKG were normal.  Recommend follow-up with your regular doctor and/or behavioral health to discuss your anxiety.  Please return to the Emergency Department if you experience any worsening of your condition.  Thank you for allowing [place] to be a part of your care.         Sabas Sous, MD 06/17/21 [phone]

## 2021-06-18 ENCOUNTER — Emergency Department (HOSPITAL_BASED_OUTPATIENT_CLINIC_OR_DEPARTMENT_OTHER)
Admission: EM | Admit: 2021-06-18 | Discharge: 2021-06-19 | Disposition: A | Discharge status: Home / Self Care | Payer: Medicaid Other | Attending: Emergency Medicine | Admitting: Emergency Medicine

## 2021-06-18 ENCOUNTER — Other Ambulatory Visit: Payer: Self-pay

## 2021-06-18 ENCOUNTER — Encounter (HOSPITAL_BASED_OUTPATIENT_CLINIC_OR_DEPARTMENT_OTHER): Payer: Self-pay | Admitting: *Deleted

## 2021-06-18 DIAGNOSIS — J45909 Unspecified asthma, uncomplicated: Secondary | ICD-10-CM | POA: Insufficient documentation

## 2021-06-18 DIAGNOSIS — R002 Palpitations: Secondary | ICD-10-CM | POA: Insufficient documentation

## 2021-06-18 DIAGNOSIS — J019 Acute sinusitis, unspecified: Secondary | ICD-10-CM | POA: Insufficient documentation

## 2021-06-18 DIAGNOSIS — H60502 Unspecified acute noninfective otitis externa, left ear: Secondary | ICD-10-CM | POA: Insufficient documentation

## 2021-06-18 DIAGNOSIS — R000 Tachycardia, unspecified: Secondary | ICD-10-CM | POA: Insufficient documentation

## 2021-06-18 MED ORDER — AMOXICILLIN-POT CLAVULANATE 875-125 MG PO TABS: 1.0000 | ORAL_TABLET | Freq: Two times a day (BID) | ORAL | 0 refills | Status: AC

## 2021-06-18 MED ORDER — FLUTICASONE PROPIONATE 50 MCG/ACT NA SUSP: 2.0000 | Freq: Every day | NASAL | 0 refills | Status: DC

## 2021-06-18 MED ORDER — OFLOXACIN 0.3 % OT SOLN: 10.0000 [drp] | Freq: Every day | OTIC | 0 refills | Status: AC

## 2021-06-18 NOTE — ED Notes (Signed)
Pt states that she is here for "panic" and that she took hydroxyzine with no relief.

## 2021-06-18 NOTE — ED Provider Notes (Signed)
[facility] EMERGENCY DEPARTMENT Provider Note   CSN: [id number] Arrival date & time: 06/18/21  2122     History Chief Complaint  Patient presents with   Ear Drainage    Dorothy Whitaker is a 25 y.o. female.  HPI  25 year old female with a history of asthma presents to the emergency department today with multiple complaints.  She states that she was diagnosed with COVID about 2 weeks ago.  Since then she has had nasal congestion that is persistent, sinus pressure and some intermittent dizziness/near syncope.  She is also had some drainage from the left ear that she started.  She has had intermittent palpitations as well.  She is been seen in the ED for assessment of her symptoms and had a reassuring work-up yesterday.  She was previously given a prescription for hydroxyzine but she states this is not helping her symptoms.  Because of all of her symptoms she has had increased anxiety  Past Medical History:  Diagnosis Date   Asthma     Patient Active Problem List   Diagnosis Date Noted   Symptomatic anemia 11/15/2019   Heavy menstrual bleeding 11/15/2019   Asthma    Acanthosis nigricans 09/21/2015   Body mass index (BMI) of 36.0-36.9 in adult 07/17/2015    History reviewed. No pertinent surgical history.   OB History   No obstetric history on file.     Family History  Problem Relation Age of Onset   Diabetes Mother     Social History   Tobacco Use   Smoking status: Never   Smokeless tobacco: Never  Vaping Use   Vaping Use: Never used  Substance Use Topics   Alcohol use: No   Drug use: No    Home Medications Prior to Admission medications   Medication Sig Start Date End Date Taking? Authorizing Provider  amoxicillin-clavulanate (AUGMENTIN) 875-125 MG tablet Take 1 tablet by mouth 2 (two) times daily for 7 days. 06/18/21 06/25/21 Yes Osmany Azer S, PA-C  fluticasone (FLONASE) 50 MCG/ACT nasal spray Place 2 sprays into both nostrils daily. 06/18/21   Yes Dewaine Morocho S, PA-C  ofloxacin (FLOXIN) 0.3 % OTIC solution Place 10 drops into the left ear daily for 7 days. 06/18/21 06/25/21 Yes Lonie Rummell S, PA-C  albuterol (VENTOLIN HFA) 108 (90 Base) MCG/ACT inhaler Inhale 2 puffs into the lungs every 6 (six) hours as needed for wheezing or shortness of breath. 06/04/21   Daphine Deutscher Mary-Margaret, FNP  ferrous sulfate 325 (65 FE) MG EC tablet Take 1 tablet (325 mg total) by mouth 2 (two) times daily. 11/15/19 02/13/20  [place], Eric J, DO  hydrOXYzine (ATARAX/VISTARIL) 25 MG tablet Take 1 tablet (25 mg total) by mouth every 6 (six) hours as needed for anxiety. 06/08/21   Glynn Octave, MD    Allergies    Patient has no known allergies.  Review of Systems   Review of Systems  Constitutional:  Negative for fever.  HENT:  Positive for congestion, ear discharge, rhinorrhea, sinus pressure and sinus pain.   Eyes:  Negative for visual disturbance.  Respiratory:  Negative for shortness of breath.   Cardiovascular:  Positive for palpitations. Negative for chest pain.  Gastrointestinal:  Negative for abdominal pain.  Genitourinary:  Negative for pelvic pain.  Musculoskeletal:  Negative for back pain.  Neurological:  Positive for dizziness and light-headedness. Negative for weakness, numbness and headaches.   Physical Exam Updated Vital Signs BP (!) 155/99 (BP Location: Right Arm)   Pulse Marland Kitchen)  103   Temp 98.3 F (36.8 C) (Oral)   Resp 20   Ht 5\' 3"  (1.6 m)   Wt 100.2 kg   LMP 05/30/2021   SpO2 95%   BMI 39.15 kg/m   Physical Exam Vitals and nursing note reviewed.  Constitutional:      General: She is not in acute distress.    Appearance: She is well-developed.  HENT:     Head: Normocephalic and atraumatic.     Ears:     Comments: Debris and mild edema in the left EAC, unable to visualize the left tm. Right tm is bulging and has a serious effusion present Eyes:     Extraocular Movements: Extraocular movements intact.      Conjunctiva/sclera: Conjunctivae normal.     Pupils: Pupils are equal, round, and reactive to light.  Cardiovascular:     Rate and Rhythm: Regular rhythm. Tachycardia present.     Heart sounds: No murmur heard. Pulmonary:     Effort: Pulmonary effort is normal. No respiratory distress.     Breath sounds: Normal breath sounds. No wheezing, rhonchi or rales.  Abdominal:     General: Bowel sounds are normal.     Palpations: Abdomen is soft.     Tenderness: There is no abdominal tenderness.  Musculoskeletal:     Cervical back: Neck supple.  Skin:    General: Skin is warm and dry.  Neurological:     Mental Status: She is alert.     Comments: Mental Status:  Alert, thought content appropriate, able to give a coherent history. Speech fluent without evidence of aphasia. Able to follow 2 step commands without difficulty.  Cranial Nerves:  II: pupils equal, round, reactive to light III,IV, VI: ptosis not present, extra-ocular motions intact bilaterally  V,VII: smile symmetric, facial light touch sensation equal VIII: hearing grossly normal to voice  X: uvula elevates symmetrically  XI: bilateral shoulder shrug symmetric and strong XII: midline tongue extension without fassiculations Motor:  Normal tone. 5/5 strength of BUE and BLE major muscle groups including strong and equal grip strength and dorsiflexion/plantar flexion Sensory: light touch normal in all extremities.     ED Results / Procedures / Treatments   Labs (all labs ordered are listed, but only abnormal results are displayed) Labs Reviewed - No data to display  EKG None  Radiology No results found.  Procedures Procedures   Medications Ordered in ED Medications - No data to display  ED Course  I have reviewed the triage vital signs and the nursing notes.  Pertinent labs & imaging results that were available during my care of the patient were reviewed by me and considered in my medical decision making (see chart  for details).    MDM Rules/Calculators/A&P                          25 year old female with multiple complaints today.  Complaining of left ear drainage, on exam she has evidence of external otitis media.  She is also had persistent nasal congestion and sinus pressure for the last several weeks after having COVID.  At this time I would expect her symptoms to have improved and given that they are persistent we will prescribe some steroid nasal spray and antibiotics to cover potential bacterial sinus infection.  I think that her dizziness could be related to this.  Her EKG is normal. Her neurologic exam is normal and I certainly do not think that she  has a central cause contributing to her symptoms today.  She is additionally complaining of palpitations and anxiety.  She was previously prescribed hydroxyzine which is not improving her symptoms today.  She does have an appoint with her PCP next week and I advised her to address her concerns regarding her anxiety with her PCP as she may need to be started on additional medications for this.  She does not appear to have an acute psychiatric condition at this time that would warrant emergent evaluation by psychiatry.  She is comfortable with this plan and is in agreement.  She agrees to return to the ED for any new or worsening symptoms in the meantime.  All questions were answered peer patient stable for discharge.   Final Clinical Impression(s) / ED Diagnoses Final diagnoses:  Acute sinusitis, recurrence not specified, unspecified location  Acute otitis externa of left ear, unspecified type  Palpitation    Rx / DC Orders ED Discharge Orders          Ordered    fluticasone (FLONASE) 50 MCG/ACT nasal spray  Daily        06/18/21 2350    amoxicillin-clavulanate (AUGMENTIN) 875-125 MG tablet  2 times daily        06/18/21 2350    ofloxacin (FLOXIN) 0.3 % OTIC solution  Daily        06/18/21 2350             Tyquasia Pant S, PA-C 06/19/21  0004    Rozelle Logan, DO 06/19/21 2053

## 2021-06-18 NOTE — Discharge Instructions (Addendum)
You were given a prescription for antibiotics. Please take the antibiotic prescription fully.   Use fluticasone nasal spray and ofloxacin ear drops as directed  Please follow up with your primary care provider within 5-7 days for re-evaluation of your symptoms. If you do not have a primary care provider, information for a healthcare clinic has been provided for you to make arrangements for follow up care. Please return to the emergency department for any new or worsening symptoms.

## 2021-06-18 NOTE — ED Triage Notes (Signed)
C/o dizziness x 2 weeks and bil ear drainage x 1 day , seen here yesterday for same DX anxiety

## 2021-06-19 NOTE — ED Notes (Signed)
Pt complains of ear pain and anxiety

## 2021-06-26 ENCOUNTER — Other Ambulatory Visit: Payer: Self-pay

## 2021-06-26 ENCOUNTER — Ambulatory Visit (INDEPENDENT_AMBULATORY_CARE_PROVIDER_SITE_OTHER): Payer: Self-pay | Admitting: Registered Nurse

## 2021-06-26 ENCOUNTER — Encounter: Payer: Self-pay | Admitting: Registered Nurse

## 2021-06-26 VITALS — BP 132/86 | HR 105 | Temp 98.3°F | Resp 16 | Ht 63.0 in | Wt 222.0 lb

## 2021-06-26 DIAGNOSIS — Z13220 Encounter for screening for lipoid disorders: Secondary | ICD-10-CM

## 2021-06-26 DIAGNOSIS — Z1329 Encounter for screening for other suspected endocrine disorder: Secondary | ICD-10-CM

## 2021-06-26 DIAGNOSIS — Z862 Personal history of diseases of the blood and blood-forming organs and certain disorders involving the immune mechanism: Secondary | ICD-10-CM

## 2021-06-26 DIAGNOSIS — Z130 Encounter for screening for diseases of the blood and blood-forming organs and certain disorders involving the immune mechanism: Secondary | ICD-10-CM

## 2021-06-26 DIAGNOSIS — Z13228 Encounter for screening for other metabolic disorders: Secondary | ICD-10-CM

## 2021-06-26 DIAGNOSIS — F410 Panic disorder [episodic paroxysmal anxiety] without agoraphobia: Secondary | ICD-10-CM

## 2021-06-26 DIAGNOSIS — F4321 Adjustment disorder with depressed mood: Secondary | ICD-10-CM

## 2021-06-26 DIAGNOSIS — F411 Generalized anxiety disorder: Secondary | ICD-10-CM

## 2021-06-26 LAB — CBC WITH DIFFERENTIAL/PLATELET
Basophils Absolute: 0 10*3/uL (ref 0.0–0.1)
Basophils Relative: 0.7 % (ref 0.0–3.0)
Eosinophils Absolute: 0.2 10*3/uL (ref 0.0–0.7)
Eosinophils Relative: 3.3 % (ref 0.0–5.0)
HCT: 41.7 % (ref 36.0–46.0)
Hemoglobin: 13.3 g/dL (ref 12.0–15.0)
Lymphocytes Relative: 28.5 % (ref 12.0–46.0)
Lymphs Abs: 2.1 10*3/uL (ref 0.7–4.0)
MCHC: 32 g/dL (ref 30.0–36.0)
MCV: 68.6 fl — ABNORMAL LOW (ref 78.0–100.0)
Monocytes Absolute: 0.6 10*3/uL (ref 0.1–1.0)
Monocytes Relative: 7.8 % (ref 3.0–12.0)
Neutro Abs: 4.4 10*3/uL (ref 1.4–7.7)
Neutrophils Relative %: 59.7 % (ref 43.0–77.0)
Platelets: 259 10*3/uL (ref 150.0–400.0)
RBC: 6.08 Mil/uL — ABNORMAL HIGH (ref 3.87–5.11)
RDW: 21.2 % — ABNORMAL HIGH (ref 11.5–15.5)
WBC: 7.3 10*3/uL (ref 4.0–10.5)

## 2021-06-26 LAB — COMPREHENSIVE METABOLIC PANEL
ALT: 29 U/L (ref 0–35)
AST: 18 U/L (ref 0–37)
Albumin: 4.7 g/dL (ref 3.5–5.2)
Alkaline Phosphatase: 95 U/L (ref 39–117)
BUN: 14 mg/dL (ref 6–23)
CO2: 21 mEq/L (ref 19–32)
Calcium: 9.8 mg/dL (ref 8.4–10.5)
Chloride: 104 mEq/L (ref 96–112)
Creatinine, Ser: 0.79 mg/dL (ref 0.40–1.20)
GFR: 104.03 mL/min (ref >60.00)
Glucose, Bld: 80 mg/dL (ref 70–99)
Potassium: 4 mEq/L (ref 3.5–5.1)
Sodium: 137 mEq/L (ref 135–145)
Total Bilirubin: 0.3 mg/dL (ref 0.2–1.2)
Total Protein: 7.9 g/dL (ref 6.0–8.3)

## 2021-06-26 LAB — B12 AND FOLATE PANEL
Folate: 11.3 ng/mL (ref >5.9)
Vitamin B-12: 431 pg/mL (ref 211–911)

## 2021-06-26 LAB — LIPID PANEL
Cholesterol: 211 mg/dL — ABNORMAL HIGH (ref 0–200)
HDL: 31.2 mg/dL — ABNORMAL LOW (ref >39.00)
LDL Cholesterol: 156 mg/dL — ABNORMAL HIGH (ref 0–99)
NonHDL: 179.72
Total CHOL/HDL Ratio: 7
Triglycerides: 118 mg/dL (ref 0.0–149.0)
VLDL: 23.6 mg/dL (ref 0.0–40.0)

## 2021-06-26 LAB — HEMOGLOBIN A1C: Hgb A1c MFr Bld: 6.5 % (ref 4.6–6.5)

## 2021-06-26 LAB — VITAMIN D 25 HYDROXY (VIT D DEFICIENCY, FRACTURES): VITD: 10.65 ng/mL — ABNORMAL LOW (ref 30.00–100.00)

## 2021-06-26 LAB — TSH: TSH: 3.38 u[IU]/mL (ref 0.35–5.50)

## 2021-06-26 MED ORDER — FLUOXETINE HCL 20 MG PO CAPS: ORAL_CAPSULE | ORAL | 3 refills | Status: DC

## 2021-06-26 MED ORDER — CLONAZEPAM 0.5 MG PO TABS: 0.5000 mg | ORAL_TABLET | Freq: Two times a day (BID) | ORAL | 0 refills | Status: DC | PRN

## 2021-06-26 NOTE — Patient Instructions (Signed)
Ms. Dorothy Whitaker to meet you  In brief:  Anxiety stinks - our best bet is a daily medication to lower anxiety. Start fluoxetine 20mg  daily. If doing well with this after 3-4 weeks can increase to 40mg  daily. Any concerns let me know - any unmanageable side effects, stop taking it. Have sent clonazepam (klonopin) 0.5mg  to take 1-2 times daily as needed for breakthrough anxiety. This is not an ideal long term medication.  Labs today will be back by this evening. I'll call with acute concerns, i'll post on MyChart for any abnormalities. We can plan on follow up for labs based on results. I want to follow up for medications in about 6 weeks. This can be in person or virtually (video chat). We can book that today at checkout  Let me know if you have concerns in the mean time  Thank you  Rich

## 2021-06-26 NOTE — Progress Notes (Signed)
New Patient Office Visit  Subjective:  Patient ID: Dorothy Whitaker, female    DOB: 03/07/1996  Age: 25 y.o. MRN: [id number]  CC:  Chief Complaint  Patient presents with   Panic Attack    Pt here for recent increase in panic attacks, pt reports going to the ER a few times for this as well, seh has also been waking up with anxiety. GAD 16   New Patient (Initial Visit)    Pt here for new patient visit but is having some anxiety concerns      HPI MARDEL GRUDZIEN presents for visit to est care.  Histories reviewed and updated with patient.   Chief concern today of anxiety Has been ongoing for some time A few ER trips in past few weeks related to panic attacks. Has not been on daily SSRI/SNRI in the past.  Lost her mom at age 89 - feels she never totally coped with this  Has been unable to work and largely unable to leave her home since this onset  Did have covid while on vacation which drastically worsened her state and left her so limited  In ER was given hydroxyzine - not effective, lorazepam - somewhat effective  Past Medical History:  Diagnosis Date   Anemia    Asthma    Encounter for blood transfusion 2021    History reviewed. No pertinent surgical history.  Family History  Problem Relation Age of Onset   Diabetes Mother    Diabetes Maternal Aunt    Diabetes type I Niece     Social History   Socioeconomic History   Marital status: Significant Other    Spouse name: Not on file   Number of children: 0   Years of education: Not on file   Highest education level: Not on file  Occupational History   Occupation: supervisor  Tobacco Use   Smoking status: Never   Smokeless tobacco: Never  Vaping Use   Vaping Use: Never used  Substance and Sexual Activity   Alcohol use: Yes    Comment: seldom   Drug use: No   Sexual activity: Yes    Birth control/protection: Condom  Other Topics Concern   Not on file  Social History Narrative   Not on file    Social Determinants of Health   Financial Resource Strain: Not on file  Food Insecurity: Not on file  Transportation Needs: Not on file  Physical Activity: Not on file  Stress: Not on file  Social Connections: Not on file  Intimate Partner Violence: Not on file    ROS Review of Systems  Constitutional: Negative.   HENT: Negative.    Eyes: Negative.   Respiratory: Negative.    Cardiovascular: Negative.   Gastrointestinal: Negative.   Genitourinary: Negative.   Musculoskeletal: Negative.   Skin: Negative.   Neurological: Negative.   Psychiatric/Behavioral: Negative.    All other systems reviewed and are negative.  Objective:   Today's Vitals: BP 132/86   Pulse (!) 105   Temp 98.3 F (36.8 C) (Temporal)   Resp 16   Ht 5\' 3"  (1.6 m)   Wt 222 lb (100.7 kg)   LMP 05/30/2021   SpO2 98%   BMI 39.33 kg/m   Physical Exam Vitals and nursing note reviewed.  Constitutional:      General: She is not in acute distress.    Appearance: Normal appearance. She is obese. She is not ill-appearing, toxic-appearing or diaphoretic.  Cardiovascular:  Rate and Rhythm: Normal rate and regular rhythm.     Pulses: Normal pulses.     Heart sounds: Normal heart sounds. No murmur heard.   No friction rub. No gallop.  Pulmonary:     Effort: Pulmonary effort is normal. No respiratory distress.     Breath sounds: Normal breath sounds. No stridor. No wheezing, rhonchi or rales.  Chest:     Chest wall: No tenderness.  Skin:    General: Skin is warm and dry.     Capillary Refill: Capillary refill takes less than 2 seconds.  Neurological:     General: No focal deficit present.     Mental Status: She is alert and oriented to person, place, and time. Mental status is at baseline.  Psychiatric:        Mood and Affect: Mood normal.        Behavior: Behavior normal.        Thought Content: Thought content normal.        Judgment: Judgment normal.    Assessment & Plan:   Problem List  Items Addressed This Visit       Other   GAD (generalized anxiety disorder) - Primary   Relevant Medications   FLUoxetine (PROZAC) 20 MG capsule   clonazePAM (KLONOPIN) 0.5 MG tablet   Other Relevant Orders   Vitamin D (25 hydroxy)   Ambulatory referral to Psychology   Unresolved grief   Relevant Orders   Ambulatory referral to Psychology   Panic attack   Relevant Medications   FLUoxetine (PROZAC) 20 MG capsule   Other Relevant Orders   Ambulatory referral to Psychology   Other Visit Diagnoses     History of anemia       Relevant Orders   Iron, TIBC and Ferritin Panel   B12 and Folate Panel   Screening for endocrine, metabolic and immunity disorder       Relevant Orders   CBC with Differential/Platelet   Comprehensive metabolic panel   Hemoglobin A1c   TSH   Lipid screening       Relevant Orders   Lipid panel       Outpatient Encounter Medications as of 06/26/2021  Medication Sig   clonazePAM (KLONOPIN) 0.5 MG tablet Take 1 tablet (0.5 mg total) by mouth 2 (two) times daily as needed for anxiety.   FLUoxetine (PROZAC) 20 MG capsule Take 1 capsule (20 mg total) by mouth daily for 28 days, THEN 2 capsules (40 mg total) daily.   albuterol (VENTOLIN HFA) 108 (90 Base) MCG/ACT inhaler Inhale 2 puffs into the lungs every 6 (six) hours as needed for wheezing or shortness of breath.   ferrous sulfate 325 (65 FE) MG EC tablet Take 1 tablet (325 mg total) by mouth 2 (two) times daily.   fluticasone (FLONASE) 50 MCG/ACT nasal spray Place 2 sprays into both nostrils daily.   hydrOXYzine (ATARAX/VISTARIL) 25 MG tablet Take 1 tablet (25 mg total) by mouth every 6 (six) hours as needed for anxiety.   No facility-administered encounter medications on file as of 06/26/2021.    Follow-up: Return in about 6 weeks (around 08/07/2021) for med check - fluoxetine.   PLAN Start fluoxetine 20mg  po qd. If tolerated well can increase to 40mg  po qd after 2-4 weeks.  Start klonopin 0.5mg  po  bid prn. Discussed risks associated with benzodiazepines. Pt voices understanding. She understands that this will not be a long term medication. Refer to counseling Labs collected. Will follow up with the  patient as warranted. Follow up in 6 weeks for med check Patient encouraged to call clinic with any questions, comments, or concerns.  [name], NP

## 2021-06-27 ENCOUNTER — Encounter: Payer: Self-pay | Admitting: Registered Nurse

## 2021-06-27 LAB — IRON,TIBC AND FERRITIN PANEL
%SAT: 8 % (calc) — ABNORMAL LOW (ref 16–45)
Ferritin: 15 ng/mL — ABNORMAL LOW (ref 16–154)
Iron: 34 ug/dL — ABNORMAL LOW (ref 40–190)
TIBC: 433 mcg/dL (calc) (ref 250–450)

## 2021-07-02 ENCOUNTER — Other Ambulatory Visit: Payer: Self-pay

## 2021-07-02 ENCOUNTER — Encounter: Payer: Self-pay | Admitting: Registered Nurse

## 2021-07-02 ENCOUNTER — Telehealth (INDEPENDENT_AMBULATORY_CARE_PROVIDER_SITE_OTHER): Payer: Self-pay | Admitting: Registered Nurse

## 2021-07-02 DIAGNOSIS — B9689 Other specified bacterial agents as the cause of diseases classified elsewhere: Secondary | ICD-10-CM

## 2021-07-02 DIAGNOSIS — J019 Acute sinusitis, unspecified: Secondary | ICD-10-CM

## 2021-07-02 MED ORDER — AZITHROMYCIN 250 MG PO TABS: ORAL_TABLET | ORAL | 0 refills | Status: AC

## 2021-07-02 MED ORDER — AZELASTINE HCL 0.1 % NA SOLN: 1.0000 | Freq: Two times a day (BID) | NASAL | 12 refills | Status: DC

## 2021-07-02 MED ORDER — PREDNISONE 10 MG PO TABS
ORAL_TABLET | ORAL | 0 refills | Status: AC
Start: 2021-07-02 — End: 2021-07-08

## 2021-07-02 NOTE — Progress Notes (Signed)
Telemedicine Encounter- SOAP NOTE Established Patient  This telephone encounter was conducted with the patient's (or proxy's) verbal consent via audio telecommunications: yes/no: Yes Patient was instructed to have this encounter in a suitably private space; and to only have persons present to whom they give permission to participate. In addition, patient identity was confirmed by use of name plus two identifiers (DOB and address).  I discussed the limitations, risks, security and privacy concerns of performing an evaluation and management service by telephone and the availability of in person appointments. I also discussed with the patient that there may be a patient responsible charge related to this service. The patient expressed understanding and agreed to proceed.  I spent a total of 16 minutes talking with the patient or their proxy.  Patient at home Provider in office  Participants: Jari Sportsman, NP and [name]  Chief Complaint  Patient presents with   URI    Congestion, head feels full and pt is hot all the time.    Subjective   Dorothy Whitaker is a 25 y.o. established patient. Telephone visit today for congestion  HPI Onset around a month ago  After course of COVID Seen in ER - given augmentin, flonase, and ofloxacin drops Ear symptoms improved, upper respiratory congestion remains Head feels full, pressure in sinuses particularly sphenoid Pnd leading to cough - yellow/green sputum Feels discomfort in chest but no breathing difficulties  Denies nvd, chest pain, fevers  Patient Active Problem List   Diagnosis Date Noted   GAD (generalized anxiety disorder) 06/26/2021   Unresolved grief 06/26/2021   Panic attack 06/26/2021   Symptomatic anemia 11/15/2019   Heavy menstrual bleeding 11/15/2019   Asthma    Acanthosis nigricans 09/21/2015   Body mass index (BMI) of 36.0-36.9 in adult 07/17/2015    Past Medical History:  Diagnosis Date   Anemia     Asthma    Encounter for blood transfusion 2021    Current Outpatient Medications  Medication Sig Dispense Refill   albuterol (VENTOLIN HFA) 108 (90 Base) MCG/ACT inhaler Inhale 2 puffs into the lungs every 6 (six) hours as needed for wheezing or shortness of breath. 8 g 0   azelastine (ASTELIN) 0.1 % nasal spray Place 1 spray into both nostrils 2 (two) times daily. Use in each nostril as directed 30 mL 12   azithromycin (ZITHROMAX) 250 MG tablet Take 2 tablets on day 1, then 1 tablet daily on days 2 through 5 6 tablet 0   clonazePAM (KLONOPIN) 0.5 MG tablet Take 1 tablet (0.5 mg total) by mouth 2 (two) times daily as needed for anxiety. 60 tablet 0   FLUoxetine (PROZAC) 20 MG capsule Take 1 capsule (20 mg total) by mouth daily for 28 days, THEN 2 capsules (40 mg total) daily. 90 capsule 3   fluticasone (FLONASE) 50 MCG/ACT nasal spray Place 2 sprays into both nostrils daily. 16 g 0   predniSONE (DELTASONE) 10 MG tablet Take 3 tablets (30 mg total) by mouth daily with breakfast for 2 days, THEN 2 tablets (20 mg total) daily with breakfast for 2 days, THEN 1 tablet (10 mg total) daily with breakfast for 2 days. 12 tablet 0   ferrous sulfate 325 (65 FE) MG EC tablet Take 1 tablet (325 mg total) by mouth 2 (two) times daily. 180 tablet 0   hydrOXYzine (ATARAX/VISTARIL) 25 MG tablet Take 1 tablet (25 mg total) by mouth every 6 (six) hours as needed for anxiety. (Patient not  taking: Reported on 07/02/2021) 10 tablet 0   No current facility-administered medications for this visit.    No Known Allergies  Social History   Socioeconomic History   Marital status: Significant Other    Spouse name: Not on file   Number of children: 0   Years of education: Not on file   Highest education level: Not on file  Occupational History   Occupation: supervisor  Tobacco Use   Smoking status: Never   Smokeless tobacco: Never  Vaping Use   Vaping Use: Never used  Substance and Sexual Activity   Alcohol  use: Yes    Comment: seldom   Drug use: No   Sexual activity: Yes    Birth control/protection: Condom  Other Topics Concern   Not on file  Social History Narrative   Not on file   Social Determinants of Health   Financial Resource Strain: Not on file  Food Insecurity: Not on file  Transportation Needs: Not on file  Physical Activity: Not on file  Stress: Not on file  Social Connections: Not on file  Intimate Partner Violence: Not on file    Review of Systems  Constitutional: Negative.   HENT:  Positive for congestion and sinus pain. Negative for ear discharge, ear pain, hearing loss, nosebleeds, sore throat and tinnitus.   Eyes: Negative.   Respiratory:  Positive for cough and sputum production. Negative for hemoptysis, shortness of breath, wheezing and stridor.   Cardiovascular: Negative.   Gastrointestinal: Negative.   Genitourinary: Negative.   Musculoskeletal: Negative.   Skin: Negative.   Neurological: Negative.   Endo/Heme/Allergies: Negative.   Psychiatric/Behavioral: Negative.    All other systems reviewed and are negative.  Objective   Vitals as reported by the patient: There were no vitals filed for this visit.  Evalene was seen today for uri.  Diagnoses and all orders for this visit:  Acute bacterial sinusitis -     azelastine (ASTELIN) 0.1 % nasal spray; Place 1 spray into both nostrils 2 (two) times daily. Use in each nostril as directed -     azithromycin (ZITHROMAX) 250 MG tablet; Take 2 tablets on day 1, then 1 tablet daily on days 2 through 5 -     predniSONE (DELTASONE) 10 MG tablet; Take 3 tablets (30 mg total) by mouth daily with breakfast for 2 days, THEN 2 tablets (20 mg total) daily with breakfast for 2 days, THEN 1 tablet (10 mg total) daily with breakfast for 2 days.   PLAN Z pack and prednisone as above.  Astelin nasal spray  Return if worsening or failing to improve Can consider ENT referral for persistent symptoms Patient encouraged  to call clinic with any questions, comments, or concerns.  I discussed the assessment and treatment plan with the patient. The patient was provided an opportunity to ask questions and all were answered. The patient agreed with the plan and demonstrated an understanding of the instructions.   The patient was advised to call back or seek an in-person evaluation if the symptoms worsen or if the condition fails to improve as anticipated.  I provided 17 minutes of non-face-to-face time during this encounter.  [name], NP  Primary Care at [facility]

## 2021-07-04 ENCOUNTER — Other Ambulatory Visit: Payer: Self-pay | Admitting: Registered Nurse

## 2021-07-04 ENCOUNTER — Encounter: Payer: Self-pay | Admitting: Registered Nurse

## 2021-07-04 DIAGNOSIS — D509 Iron deficiency anemia, unspecified: Secondary | ICD-10-CM

## 2021-07-04 DIAGNOSIS — E559 Vitamin D deficiency, unspecified: Secondary | ICD-10-CM

## 2021-07-04 MED ORDER — VITAMIN D (ERGOCALCIFEROL) 1.25 MG (50000 UNIT) PO CAPS: 50000.0000 [IU] | ORAL_CAPSULE | ORAL | 0 refills | Status: AC

## 2021-07-04 MED ORDER — FERROUS SULFATE 325 (65 FE) MG PO TBEC: 325.0000 mg | DELAYED_RELEASE_TABLET | Freq: Two times a day (BID) | ORAL | 0 refills | Status: DC

## 2021-07-05 ENCOUNTER — Other Ambulatory Visit: Payer: Self-pay | Admitting: Registered Nurse

## 2021-07-05 ENCOUNTER — Encounter: Payer: Self-pay | Admitting: Registered Nurse

## 2021-07-05 DIAGNOSIS — D509 Iron deficiency anemia, unspecified: Secondary | ICD-10-CM

## 2021-07-05 MED ORDER — FERROUS SULFATE 300 (60 FE) MG/5ML PO SYRP: 300.0000 mg | ORAL_SOLUTION | Freq: Every day | ORAL | 3 refills | Status: DC

## 2021-07-05 NOTE — Telephone Encounter (Signed)
Patient comment: Is it possible for me to get a crushable version of this or even a liquid version as I am terrified of taking pills whole even with water

## 2021-07-05 NOTE — Telephone Encounter (Signed)
Sent liquid -   Thanks  [employer]

## 2021-07-11 ENCOUNTER — Encounter: Payer: Self-pay | Admitting: Registered Nurse

## 2021-07-12 ENCOUNTER — Encounter: Payer: Self-pay | Admitting: Registered Nurse

## 2021-07-12 ENCOUNTER — Telehealth (INDEPENDENT_AMBULATORY_CARE_PROVIDER_SITE_OTHER): Payer: Medicaid Other | Admitting: Registered Nurse

## 2021-07-12 ENCOUNTER — Other Ambulatory Visit: Payer: Self-pay

## 2021-07-12 DIAGNOSIS — R0981 Nasal congestion: Secondary | ICD-10-CM

## 2021-07-12 NOTE — Progress Notes (Signed)
Video Encounter- SOAP NOTE Established Patient  This video encounter was conducted with the patient's (or proxy's) verbal consent via audio telecommunications: yes  Patient was instructed to have this encounter in a suitably private space; and to only have persons present to whom they give permission to participate. In addition, patient identity was confirmed by use of name plus two identifiers (DOB and address).  I discussed the limitations, risks, security and privacy concerns of performing an evaluation and management service by telephone and the availability of in person appointments. I also discussed with the patient that there may be a patient responsible charge related to this service. The patient expressed understanding and agreed to proceed.  I spent a total of 15 minutes talking with the patient or their proxy.  Patient at home Provider in office  Participants: Jari Sportsman, NP and [name]  Chief Complaint  Patient presents with   Cough    congestion    Subjective   Dorothy Whitaker is a 25 y.o. established patient. Video visit today for congestion  HPI Ongoing sinus pressure, congestion, throughout face. Maxillary, frontal, and sphenoid Sometimes gets sensation of chest warmness and fullness, but no shortness of breath and very limited ongoing cough Denies nvd, headache, visual changes, wheezing, doe, blurred vision  Last visit we had pursued z pack, prednisone, and astelin nasal spray.  This increased production but did not resolve symptoms.  Patient Active Problem List   Diagnosis Date Noted   GAD (generalized anxiety disorder) 06/26/2021   Unresolved grief 06/26/2021   Panic attack 06/26/2021   Symptomatic anemia 11/15/2019   Heavy menstrual bleeding 11/15/2019   Asthma    Acanthosis nigricans 09/21/2015   Body mass index (BMI) of 36.0-36.9 in adult 07/17/2015    Past Medical History:  Diagnosis Date   Anemia    Asthma    Encounter for  blood transfusion 2021    Current Outpatient Medications  Medication Sig Dispense Refill   albuterol (VENTOLIN HFA) 108 (90 Base) MCG/ACT inhaler Inhale 2 puffs into the lungs every 6 (six) hours as needed for wheezing or shortness of breath. 8 g 0   azelastine (ASTELIN) 0.1 % nasal spray Place 1 spray into both nostrils 2 (two) times daily. Use in each nostril as directed 30 mL 12   clonazePAM (KLONOPIN) 0.5 MG tablet Take 1 tablet (0.5 mg total) by mouth 2 (two) times daily as needed for anxiety. 60 tablet 0   ferrous sulfate 300 (60 Fe) MG/5ML syrup Take 5 mLs (300 mg total) by mouth daily. 450 mL 3   FLUoxetine (PROZAC) 20 MG capsule Take 1 capsule (20 mg total) by mouth daily for 28 days, THEN 2 capsules (40 mg total) daily. 90 capsule 3   fluticasone (FLONASE) 50 MCG/ACT nasal spray Place 2 sprays into both nostrils daily. 16 g 0   Vitamin D, Ergocalciferol, (DRISDOL) 1.25 MG (50000 UNIT) CAPS capsule Take 1 capsule (50,000 Units total) by mouth every 7 (seven) days. 12 capsule 0   No current facility-administered medications for this visit.    No Known Allergies  Social History   Socioeconomic History   Marital status: Significant Other    Spouse name: Not on file   Number of children: 0   Years of education: Not on file   Highest education level: Not on file  Occupational History   Occupation: supervisor  Tobacco Use   Smoking status: Never   Smokeless tobacco: Never  Vaping Use  Vaping Use: Never used  Substance and Sexual Activity   Alcohol use: Yes    Comment: seldom   Drug use: No   Sexual activity: Yes    Birth control/protection: Condom  Other Topics Concern   Not on file  Social History Narrative   Not on file   Social Determinants of Health   Financial Resource Strain: Not on file  Food Insecurity: Not on file  Transportation Needs: Not on file  Physical Activity: Not on file  Stress: Not on file  Social Connections: Not on file  Intimate Partner  Violence: Not on file    Review of Systems  Constitutional: Negative.   HENT: Negative.    Eyes: Negative.   Respiratory: Negative.    Cardiovascular: Negative.   Gastrointestinal: Negative.   Genitourinary: Negative.   Musculoskeletal: Negative.   Skin: Negative.   Neurological: Negative.   Endo/Heme/Allergies: Negative.   Psychiatric/Behavioral: Negative.    All other systems reviewed and are negative.  Objective   Vitals as reported by the patient: There were no vitals filed for this visit.  Dorothy Whitaker was seen today for cough.  Diagnoses and all orders for this visit:  Sinus congestion -     Ambulatory referral to ENT   PLAN Unsure of what is happening - will refer to ENT for persistent symptoms Return if worsening or failing to improve Patient encouraged to call clinic with any questions, comments, or concerns.  I discussed the assessment and treatment plan with the patient. The patient was provided an opportunity to ask questions and all were answered. The patient agreed with the plan and demonstrated an understanding of the instructions.   The patient was advised to call back or seek an in-person evaluation if the symptoms worsen or if the condition fails to improve as anticipated.  I provided 15 minutes of non-face-to-face time during this encounter.  [name], NP

## 2021-07-12 NOTE — Progress Notes (Signed)
I connected with  Dorothy Whitaker on 07/12/21 by a video enabled telemedicine application and verified that I am speaking with the correct person using two identifiers.   I discussed the limitations of evaluation and management by telemedicine. The patient expressed understanding and agreed to proceed.

## 2021-08-02 ENCOUNTER — Other Ambulatory Visit: Payer: Self-pay | Admitting: Registered Nurse

## 2021-08-02 DIAGNOSIS — F411 Generalized anxiety disorder: Secondary | ICD-10-CM

## 2021-08-07 ENCOUNTER — Ambulatory Visit: Payer: Medicaid Other | Admitting: Registered Nurse

## 2021-08-12 ENCOUNTER — Other Ambulatory Visit: Payer: Self-pay | Admitting: Registered Nurse

## 2021-08-12 DIAGNOSIS — F411 Generalized anxiety disorder: Secondary | ICD-10-CM

## 2021-08-13 MED ORDER — CLONAZEPAM 0.5 MG PO TABS: 0.5000 mg | ORAL_TABLET | Freq: Two times a day (BID) | ORAL | 0 refills | Status: DC | PRN

## 2021-08-13 NOTE — Telephone Encounter (Signed)
Patient is requesting a refill of the following medications: Requested Prescriptions   Pending Prescriptions Disp Refills   clonazePAM (KLONOPIN) 0.5 MG tablet 60 tablet 0    Sig: Take 1 tablet (0.5 mg total) by mouth 2 (two) times daily as needed for anxiety.    Date of patient request: 08/13/21 Last office visit: 07/12/21 video Date of last refill: 06/26/21 Last refill amount: 60

## 2021-09-11 ENCOUNTER — Ambulatory Visit: Payer: BC Managed Care – PPO | Admitting: Registered Nurse

## 2021-09-29 ENCOUNTER — Other Ambulatory Visit: Payer: Self-pay | Admitting: Registered Nurse

## 2021-10-18 ENCOUNTER — Ambulatory Visit: Payer: BC Managed Care – PPO | Admitting: Registered Nurse

## 2021-12-12 ENCOUNTER — Other Ambulatory Visit: Payer: Self-pay | Admitting: Registered Nurse

## 2021-12-12 DIAGNOSIS — F411 Generalized anxiety disorder: Secondary | ICD-10-CM

## 2021-12-13 NOTE — Telephone Encounter (Signed)
Patient is requesting a refill of the following medications: Requested Prescriptions   Pending Prescriptions Disp Refills   clonazePAM (KLONOPIN) 0.5 MG tablet 60 tablet 0    Sig: Take 1 tablet (0.5 mg total) by mouth 2 (two) times daily as needed for anxiety.    Date of patient request: 12/12/2021 Last office visit: 06/26/2021 Date of last refill: 08/13/2021 Last refill amount: 60 tablets  Follow up time period per chart: 01/29/2022

## 2021-12-16 MED ORDER — CLONAZEPAM 0.5 MG PO TABS: 0.5000 mg | ORAL_TABLET | Freq: Two times a day (BID) | ORAL | 0 refills | Status: DC | PRN

## 2021-12-20 ENCOUNTER — Ambulatory Visit: Payer: BC Managed Care – PPO | Admitting: Registered Nurse

## 2022-01-09 ENCOUNTER — Encounter: Payer: Self-pay | Admitting: Registered Nurse

## 2022-01-10 ENCOUNTER — Encounter: Payer: Self-pay | Admitting: Registered Nurse

## 2022-01-13 IMAGING — DX DG CHEST 1V PORT
1 series · 1 of 1 positions shown · non-contrast
Comparison: Radiograph 09/28/2018

CLINICAL DATA: Chest pain, COVID positive 06/04/2021

EXAM:
PORTABLE CHEST 1 VIEW

[chest ap]
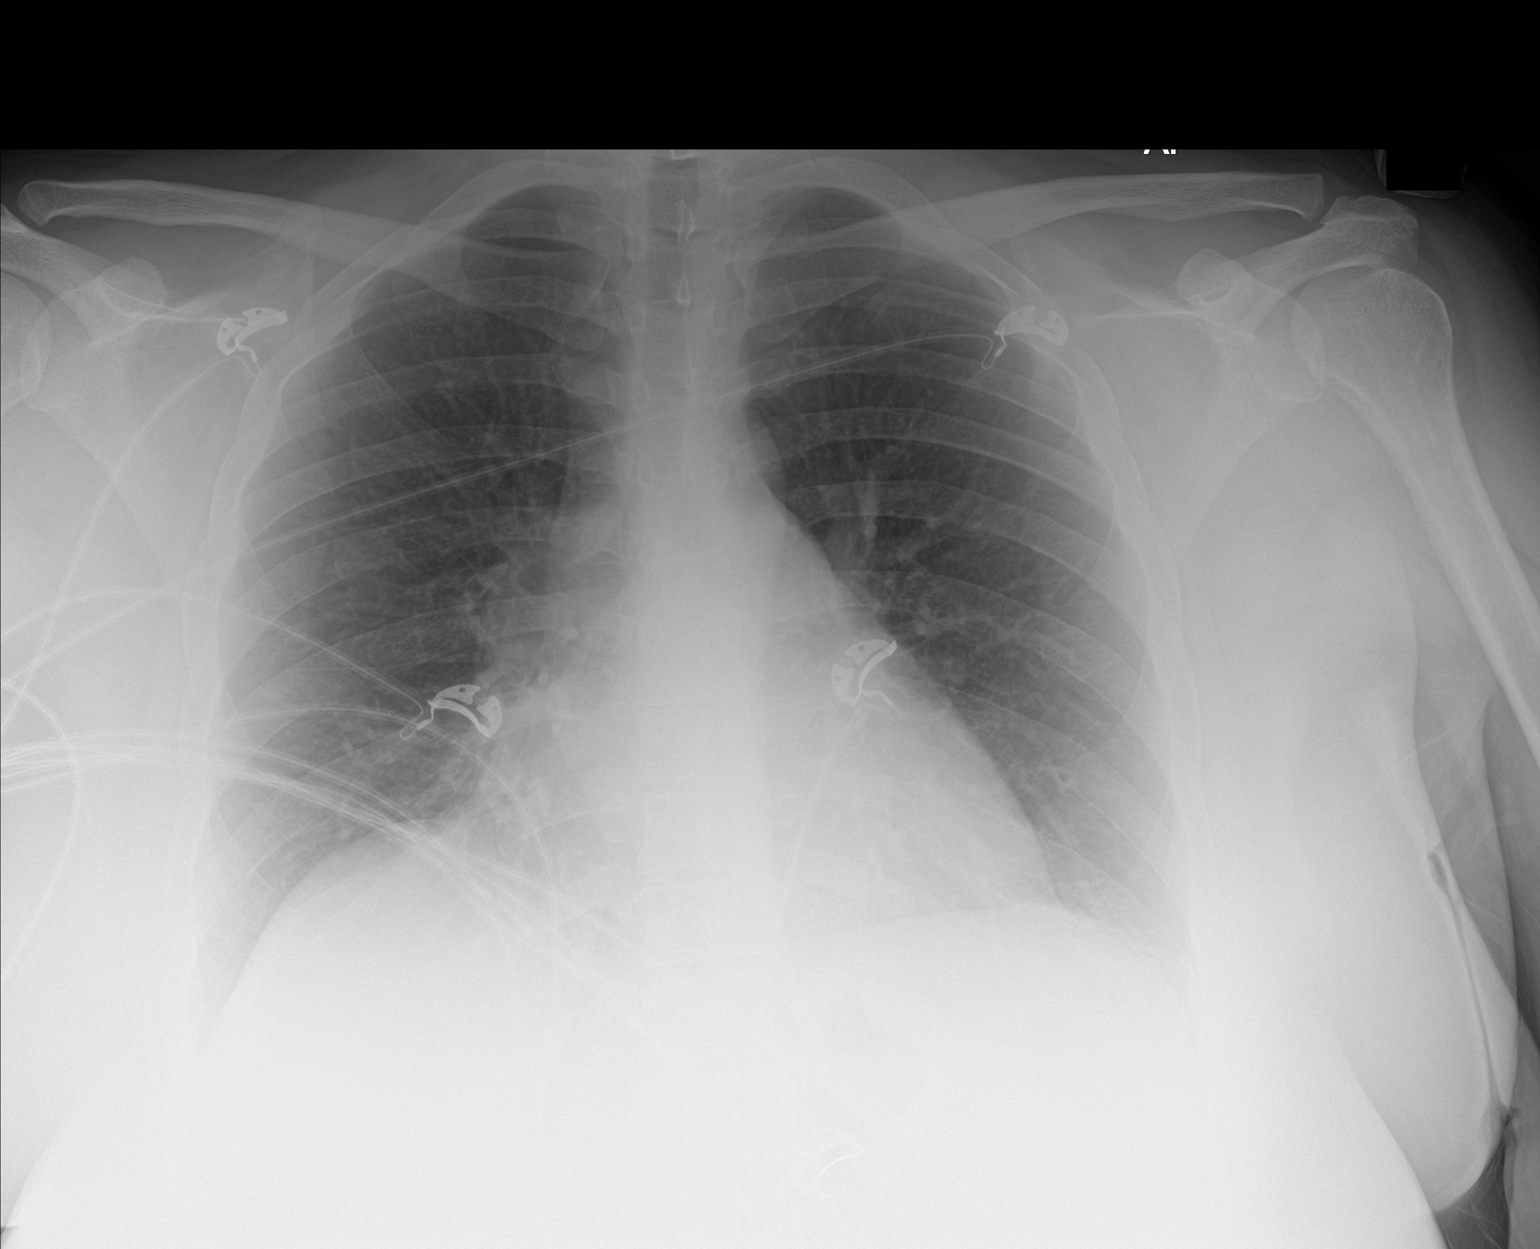

[1 of 1 positions shown; findings below may reference images not displayed]

FINDINGS: No consolidation, features of edema, pneumothorax, or effusion.
Pulmonary vascularity is normally distributed. The cardiomediastinal
contours are unremarkable. No acute osseous or soft tissue
abnormality.
IMPRESSION: No acute cardiopulmonary abnormality.

## 2022-01-29 ENCOUNTER — Ambulatory Visit: Payer: BC Managed Care – PPO | Admitting: Registered Nurse

## 2022-02-07 ENCOUNTER — Encounter: Payer: Self-pay | Admitting: Registered Nurse

## 2022-03-05 ENCOUNTER — Other Ambulatory Visit: Payer: Self-pay | Admitting: Registered Nurse

## 2022-03-05 DIAGNOSIS — F411 Generalized anxiety disorder: Secondary | ICD-10-CM

## 2022-03-06 MED ORDER — CLONAZEPAM 0.5 MG PO TABS: 0.5000 mg | ORAL_TABLET | Freq: Two times a day (BID) | ORAL | 0 refills | Status: DC | PRN

## 2022-03-06 NOTE — Telephone Encounter (Signed)
Patient is requesting a refill of the following medications: ?Requested Prescriptions  ? ?Pending Prescriptions Disp Refills  ? clonazePAM (KLONOPIN) 0.5 MG tablet 60 tablet 0  ?  Sig: Take 1 tablet (0.5 mg total) by mouth 2 (two) times daily as needed for anxiety.  ? ?Patient no showed 3 times and canceled 2 since last video visit in September. ? ?Date of patient request: 03/05/2022 ?Last office visit: 07/2021 ?Date of last refill: 2/13/ ?Last refill amount: 60 tablets  ?Follow up time period per chart:  None ?

## 2022-05-23 ENCOUNTER — Encounter: Payer: Self-pay | Admitting: Registered Nurse

## 2022-05-23 ENCOUNTER — Other Ambulatory Visit: Payer: Self-pay | Admitting: Registered Nurse

## 2022-05-23 DIAGNOSIS — F411 Generalized anxiety disorder: Secondary | ICD-10-CM

## 2022-05-23 NOTE — Telephone Encounter (Signed)
Patient is requesting a refill of the following medications: Requested Prescriptions   Pending Prescriptions Disp Refills   clonazePAM (KLONOPIN) 0.5 MG tablet 60 tablet 0    Sig: Take 1 tablet (0.5 mg total) by mouth 2 (two) times daily as needed for anxiety.    Date of patient request: 05/23/22 Last office visit: 07/12/22 Date of last refill: 03/06/22 Last refill amount: 60

## 2022-05-29 MED ORDER — CLONAZEPAM 0.5 MG PO TABS: 0.5000 mg | ORAL_TABLET | Freq: Two times a day (BID) | ORAL | 0 refills | Status: DC | PRN

## 2022-05-29 NOTE — Telephone Encounter (Signed)
Patient is requesting a refill of the following medications: Requested Prescriptions   Pending Prescriptions Disp Refills   clonazePAM (KLONOPIN) 0.5 MG tablet 60 tablet 0    Sig: Take 1 tablet (0.5 mg total) by mouth 2 (two) times daily as needed for anxiety.    Date of patient request: 05/28/22 Last office visit: 07/12/21 Date of last refill: 03/06/22 Last refill amount: 60

## 2022-07-18 ENCOUNTER — Other Ambulatory Visit: Payer: Self-pay | Admitting: Family Medicine

## 2022-07-18 DIAGNOSIS — F411 Generalized anxiety disorder: Secondary | ICD-10-CM

## 2022-07-24 ENCOUNTER — Other Ambulatory Visit: Payer: Self-pay | Admitting: Family Medicine

## 2022-07-24 DIAGNOSIS — F411 Generalized anxiety disorder: Secondary | ICD-10-CM

## 2022-08-06 ENCOUNTER — Encounter: Payer: Self-pay | Admitting: Family Medicine

## 2022-08-06 ENCOUNTER — Telehealth (INDEPENDENT_AMBULATORY_CARE_PROVIDER_SITE_OTHER): Payer: Self-pay | Admitting: Family Medicine

## 2022-08-06 VITALS — Ht 63.0 in | Wt 189.0 lb

## 2022-08-06 DIAGNOSIS — F411 Generalized anxiety disorder: Secondary | ICD-10-CM

## 2022-08-06 DIAGNOSIS — E559 Vitamin D deficiency, unspecified: Secondary | ICD-10-CM

## 2022-08-06 MED ORDER — CLONAZEPAM 0.5 MG PO TABS: 0.5000 mg | ORAL_TABLET | Freq: Two times a day (BID) | ORAL | 0 refills | Status: AC | PRN

## 2022-08-06 MED ORDER — FLUOXETINE HCL 40 MG PO CAPS: 40.0000 mg | ORAL_CAPSULE | Freq: Every day | ORAL | 0 refills | Status: DC

## 2022-08-06 NOTE — Patient Instructions (Addendum)
Please have lab checked at the [facility] lab location below.  If vitamin D is still low we can discuss further treatment.  [facility] in 8:30-4:30 during weekdays, no appointment needed [place].  [place], [place] [address]   Based on continued need of clonazepam, I think higher dose of fluoxetine will be helpful.  I did refill clonazepam temporarily, but try 40 mg dose fluoxetine, and recheck with me in the next 4 to 6 weeks.  If any side effects on that dose, let me know but I think you will do well.  See other information below on managing anxiety.  Let me know if there are questions and take care.  Managing Anxiety, Adult After being diagnosed with anxiety, you may be relieved to know why you have felt or behaved a certain way. You may also feel overwhelmed about the treatment ahead and what it will mean for your life. With care and support, you can manage this condition. How to manage lifestyle changes Managing stress and anxiety  Stress is your body's reaction to life changes and events, both good and bad. Most stress will last just a few hours, but stress can be ongoing and can lead to more than just stress. Although stress can play a major role in anxiety, it is not the same as anxiety. Stress is usually caused by something external, such as a deadline, test, or competition. Stress normally passes after the triggering event has ended.  Anxiety is caused by something internal, such as imagining a terrible outcome or worrying that something will go wrong that will devastate you. Anxiety often does not go away even after the triggering event is over, and it can become long-term (chronic) worry. It is important to understand the differences between stress and anxiety and to manage your stress effectively so that it does not lead to an anxious response. Talk with your health care provider or a counselor to learn more about reducing anxiety and stress. He or she may suggest tension  reduction techniques, such as: Music therapy. Spend time creating or listening to music that you enjoy and that inspires you. Mindfulness-based meditation. Practice being aware of your normal breaths while not trying to control your breathing. It can be done while sitting or walking. Centering prayer. This involves focusing on a word, phrase, or sacred image that means something to you and brings you peace. Deep breathing. To do this, expand your stomach and inhale slowly through your nose. Hold your breath for 3-5 seconds. Then exhale slowly, letting your stomach muscles relax. Self-talk. Learn to notice and identify thought patterns that lead to anxiety reactions and change those patterns to thoughts that feel peaceful. Muscle relaxation. Taking time to tense muscles and then relax them. Choose a tension reduction technique that fits your lifestyle and personality. These techniques take time and practice. Set aside 5-15 minutes a day to do them. Therapists can offer counseling and training in these techniques. The training to help with anxiety may be covered by some insurance plans. Other things you can do to manage stress and anxiety include: Keeping a stress diary. This can help you learn what triggers your reaction and then learn ways to manage your response. Thinking about how you react to certain situations. You may not be able to control everything, but you can control your response. Making time for activities that help you relax and not feeling guilty about spending your time in this way. Doing visual imagery. This  involves imagining or creating mental pictures to help you relax. Practicing yoga. Through yoga poses, you can lower tension and promote relaxation.  Medicines Medicines can help ease symptoms. Medicines for anxiety include: Antidepressant medicines. These are usually prescribed for long-term daily control. Anti-anxiety medicines. These may be added in severe cases, especially  when panic attacks occur. Medicines will be prescribed by a health care provider. When used together, medicines, psychotherapy, and tension reduction techniques may be the most effective treatment. Relationships Relationships can play a big part in helping you recover. Try to spend more time connecting with trusted friends and family members. Consider going to couples counseling if you have a partner, taking family education classes, or going to family therapy. Therapy can help you and others better understand your condition. How to recognize changes in your anxiety Everyone responds differently to treatment for anxiety. Recovery from anxiety happens when symptoms decrease and stop interfering with your daily activities at home or work. This may mean that you will start to: Have better concentration and focus. Worry will interfere less in your daily thinking. Sleep better. Be less irritable. Have more energy. Have improved memory. It is also important to recognize when your condition is getting worse. Contact your health care provider if your symptoms interfere with home or work and you feel like your condition is not improving. Follow these instructions at home: Activity Exercise. Adults should do the following: Exercise for at least 150 minutes each week. The exercise should increase your heart rate and make you sweat (moderate-intensity exercise). Strengthening exercises at least twice a week. Get the right amount and quality of sleep. Most adults need 7-9 hours of sleep each night. Lifestyle  Eat a healthy diet that includes plenty of vegetables, fruits, whole grains, low-fat dairy products, and lean protein. Do not eat a lot of foods that are high in fats, added sugars, or salt (sodium). Make choices that simplify your life. Do not use any products that contain nicotine or tobacco. These products include cigarettes, chewing tobacco, and vaping devices, such as e-cigarettes. If you need  help quitting, ask your health care provider. Avoid caffeine, alcohol, and certain over-the-counter cold medicines. These may make you feel worse. Ask your pharmacist which medicines to avoid. General instructions Take over-the-counter and prescription medicines only as told by your health care provider. Keep all follow-up visits. This is important. Where to find support You can get help and support from these sources: Self-help groups. Online and [employer]. A trusted spiritual leader. Couples counseling. Family education classes. Family therapy. Where to find more information You may find that joining a support group helps you deal with your anxiety. The following sources can help you locate counselors or support groups near you: Mental Health America: www.mentalhealthamerica.net Anxiety and Depression Association of [place] (ADAA): [employer].de [employer] on Mental Illness (NAMI): www.nami.org Contact a health care provider if: You have a hard time staying focused or finishing daily tasks. You spend many hours a day feeling worried about everyday life. You become exhausted by worry. You start to have headaches or frequently feel tense. You develop chronic nausea or diarrhea. Get help right away if: You have a racing heart and shortness of breath. You have thoughts of hurting yourself or others. If you ever feel like you may hurt yourself or others, or have thoughts about taking your own life, get help right away. Go to your nearest emergency department or: Call your local emergency services (911 in the [place] Call a  suicide crisis helpline, such as the National Suicide Prevention Lifeline at 940 348 9988 or 988 in the [place] This is open 24 hours a day in the [place] Text the Crisis Text Line at 352-475-8683 (in the [place] Summary Taking steps to learn and use tension reduction techniques can help calm you and help prevent triggering an anxiety reaction. When used  together, medicines, psychotherapy, and tension reduction techniques may be the most effective treatment. Family, friends, and partners can play a big part in supporting you. This information is not intended to replace advice given to you by your health care provider. Make sure you discuss any questions you have with your health care provider. Document Revised: 05/15/2021 Document Reviewed: 02/10/2021 Elsevier Patient Education  2023 [employer].

## 2022-08-06 NOTE — Progress Notes (Signed)
Virtual Visit via audio Note  I connected with Dorothy Whitaker on 08/06/22 at 3:29 PM by a video enabled telemedicine application. Unable to see/hear patient. Changed to audio call.  and verified that I am speaking with the correct person using two identifiers.  Patient location: home - by self.  My location: office - Dorothy Whitaker.    I discussed the limitations, risks, security and privacy concerns of performing an evaluation and management service by telephone and the availability of in person appointments. I also discussed with the patient that there may be a patient responsible charge related to this service. The patient expressed understanding and agreed to proceed, consent obtained  Chief complaint:  Chief Complaint  Patient presents with   Medication Refill    Med refill    History of Present Illness: Dorothy Whitaker is a 26 y.o. female  General anxiety disorder Previous patient of Maximiano Coss. Last seen for video visit in September 2022 for acute issues.  Anxiety discussed in August 2022.  Note reviewed.  Had not been on SSRI, SNRI previously.  Had taken hydroxyzine, and then lorazepam.  Started on fluoxetine 20 mg daily with option to increase to 40 mg, Klonopin 0.5 mg twice daily as needed.  6-week follow-up recommended. Controlled substance database reviewed.  Clonazepam 0.5 mg #60 filled on 06/26/2021, 08/13/2021, 12/18/2021, 03/06/2022, 05/29/2022, however only for #20 on 05/29/2022. Still taking 20mg  prozac. Anxiety has improved. Tried stopping meds, anxiety returned. Still anxiety at times. Less need for klonopin but taking once per week with increased anxiety. No counselor. Working full time and in school. Still figuring next step. Out of klonopin.      06/26/2021    7:38 AM  GAD 7 : Generalized Anxiety Score  Nervous, Anxious, on Edge 3  Control/stop worrying 2  Worry too much - different things 3  Trouble relaxing 3  Restless 1  Easily annoyed or irritable 1   Afraid - awful might happen 3  Total GAD 7 Score 16      08/06/2022    2:20 PM 07/12/2021   12:11 PM 07/02/2021    9:38 AM 09/09/2017    9:47 AM 08/26/2017    9:00 AM  Depression screen PHQ 2/9  Decreased Interest 0 0 0 0 0  Down, Depressed, Hopeless 0 0 0 0 0  PHQ - 2 Score 0 0 0 0 0  Altered sleeping 1 0 0    Tired, decreased energy 0 0 0    Change in appetite 1 0 0    Feeling bad or failure about yourself  0 0 0    Trouble concentrating 0 0 0    Moving slowly or fidgety/restless 0 0 0    Suicidal thoughts 0 0 0    PHQ-9 Score 2 0 0    Difficult doing work/chores  Not difficult at all Not difficult at all         Vitamin D deficiency: Reading of 10.65 on 06/26/21.  50k unit dosing for 12 weeks with repeat testing planned. Has not been rechecked.  No current supplement.     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 No Known Allergies Prior to Admission medications   Medication Sig Start Date End Date Taking? Authorizing Provider  albuterol (VENTOLIN HFA) 108 (90 Base) MCG/ACT inhaler Inhale 2 puffs into the lungs every 6 (six) hours as needed for wheezing or shortness of breath. 06/04/21  Yes Martin, Mary-Margaret, FNP  azelastine (ASTELIN) 0.1 % nasal spray Place 1 spray into both nostrils 2 (two) times daily. Use in each nostril as directed 07/02/21  Yes [name], NP  clonazePAM (KLONOPIN) 0.5 MG tablet Take 1 tablet (0.5 mg total) by mouth 2 (two) times daily as needed for anxiety. 05/29/22  Yes [name], NP  ferrous sulfate 300 (60 Fe)  MG/5ML syrup Take 5 mLs (300 mg total) by mouth daily. 07/05/21  Yes [name], NP  ferrous sulfate 325 (65 FE) MG EC tablet TAKE 1 TABLET BY MOUTH TWICE A DAY Patient not taking: Reported on 08/06/2022 09/30/21   [name], NP  FLUoxetine (PROZAC) 20 MG capsule TAKE 1 CAPSULE (20 MG TOTAL) BY MOUTH DAILY FOR 28 DAYS, THEN 2 CAPSULES (40 MG TOTAL) DAILY. 08/02/21 02/12/22  [name], NP  fluticasone (FLONASE) 50 MCG/ACT nasal spray Place 2 sprays into both nostrils daily. Patient not taking: Reported on 08/06/2022 06/18/21   Couture, Cortni S, PA-C  Vitamin D, Ergocalciferol, (DRISDOL) 1.25 MG (50000 UNIT) CAPS capsule Take 1 capsule (50,000 Units total) by mouth every 7 (seven) days. Patient not taking: Reported on 08/06/2022 07/04/21   [name], NP   Social History   Socioeconomic History   Marital status: Significant Other    Spouse name: Not on file   Number of children: 0   Years of education: Not on file   Highest education level: Not on file  Occupational History   Occupation: supervisor  Tobacco Use   Smoking status: Never   Smokeless tobacco: Never  Vaping Use   Vaping Use: Never used  Substance and Sexual Activity   Alcohol use: Yes    Comment: seldom   Drug use: No   Sexual activity: Yes    Birth control/protection: Condom  Other Topics Concern   Not on file  Social History Narrative   Not on file   Social Determinants of Health   Financial Resource Strain: Not on file  Food Insecurity: Not on file  Transportation Needs: Not on file  Physical Activity: Not on file  Stress: Not on file  Social Connections: Not on file  Intimate Partner Violence: Not on file    Observations/Objective: There were no vitals filed for this visit., 12 minutes spent during visit on phone Speaking full senses no distress, euthymic mood based on conversational phone, did not appear to be responding to internal stimuli.  No SI, other mood symptoms as above.  All  questions answered with understanding of plan expressed  Assessment and Plan: GAD (generalized anxiety disorder) Improved since starting medication, still some need for clonazepam approximately once per week.  Anticipate improvement with higher dose fluoxetine 40 mg.  Clonazepam temporarily refilled if needed.  Recheck next 4 to 6 weeks, and will need to establish with new PCP after that visit.  Vitamin D deficiency Unknown level after prescription strength dosing.  Check labs and decide if repeat prescription strength 50,000 unit dosing or over-the-counter dosing depending on results.  Follow Up Instructions: 4 to 6 weeks, video visit  is fine   I discussed the assessment and treatment plan with the patient. The patient was provided an opportunity to ask questions and all were answered. The patient agreed with the plan and demonstrated an understanding of the instructions.   The patient was advised to call back or seek an in-person evaluation if the symptoms worsen or if the condition fails to improve as anticipated.   Shade Flood, MD

## 2022-08-08 ENCOUNTER — Other Ambulatory Visit: Payer: Self-pay | Admitting: Lab

## 2022-08-08 DIAGNOSIS — D509 Iron deficiency anemia, unspecified: Secondary | ICD-10-CM

## 2022-08-08 MED ORDER — FERROUS SULFATE 300 (60 FE) MG/5ML PO SYRP
300.0000 mg | ORAL_SOLUTION | Freq: Every day | ORAL | 3 refills | Status: DC
Start: 2022-08-08 — End: 2023-09-11

## 2022-08-08 NOTE — Progress Notes (Unsigned)
Called pt to inform her that her prescription was sent in but her mailbox was full. Will try to call again.Called on 08/08/22 at 2:18

## 2022-09-01 ENCOUNTER — Telehealth: Payer: Self-pay | Admitting: Physician Assistant

## 2022-09-01 DIAGNOSIS — J040 Acute laryngitis: Secondary | ICD-10-CM

## 2022-09-01 DIAGNOSIS — B9689 Other specified bacterial agents as the cause of diseases classified elsewhere: Secondary | ICD-10-CM

## 2022-09-01 DIAGNOSIS — J208 Acute bronchitis due to other specified organisms: Secondary | ICD-10-CM

## 2022-09-01 MED ORDER — PSEUDOEPH-BROMPHEN-DM 30-2-10 MG/5ML PO SYRP
5.0000 mL | ORAL_SOLUTION | Freq: Four times a day (QID) | ORAL | 0 refills | Status: DC | PRN
Start: 2022-09-01 — End: 2023-01-16

## 2022-09-01 MED ORDER — BENZONATATE 100 MG PO CAPS: 100.0000 mg | ORAL_CAPSULE | Freq: Three times a day (TID) | ORAL | 0 refills | Status: DC | PRN

## 2022-09-01 MED ORDER — AZITHROMYCIN 250 MG PO TABS
ORAL_TABLET | ORAL | 0 refills | Status: AC
Start: 2022-09-01 — End: 2022-09-06

## 2022-09-01 MED ORDER — PREDNISONE 20 MG PO TABS: 40.0000 mg | ORAL_TABLET | Freq: Every day | ORAL | 0 refills | Status: DC

## 2022-09-01 NOTE — Patient Instructions (Signed)
Dorothy Whitaker, thank you for joining Margaretann Loveless, PA-C for today's virtual visit.  While this provider is not your primary care provider (PCP), if your PCP is located in our provider database this encounter information will be shared with them immediately following your visit.   A [facility] MyChart account gives you access to today's visit and all your visits, tests, and labs performed at [facility] " click here if you don't have a [facility] MyChart account or go to [email or web address]  Consent: (Patient) Dorothy Whitaker provided verbal consent for this virtual visit at the beginning of the encounter.  Current Medications:  Current Outpatient Medications:    azithromycin (ZITHROMAX) 250 MG tablet, Take 2 tablets on day 1, then 1 tablet daily on days 2 through 5, Disp: 6 tablet, Rfl: 0   benzonatate (TESSALON) 100 MG capsule, Take 1 capsule (100 mg total) by mouth 3 (three) times daily as needed., Disp: 30 capsule, Rfl: 0   brompheniramine-pseudoephedrine-DM 30-2-10 MG/5ML syrup, Take 5 mLs by mouth 4 (four) times daily as needed., Disp: 120 mL, Rfl: 0   predniSONE (DELTASONE) 20 MG tablet, Take 2 tablets (40 mg total) by mouth daily with breakfast., Disp: 10 tablet, Rfl: 0   albuterol (VENTOLIN HFA) 108 (90 Base) MCG/ACT inhaler, Inhale 2 puffs into the lungs every 6 (six) hours as needed for wheezing or shortness of breath., Disp: 8 g, Rfl: 0   azelastine (ASTELIN) 0.1 % nasal spray, Place 1 spray into both nostrils 2 (two) times daily. Use in each nostril as directed, Disp: 30 mL, Rfl: 12   clonazePAM (KLONOPIN) 0.5 MG tablet, Take 1 tablet (0.5 mg total) by mouth 2 (two) times daily as needed for anxiety., Disp: 20 tablet, Rfl: 0   ferrous sulfate 300 (60 Fe) MG/5ML syrup, Take 5 mLs (300 mg total) by mouth daily., Disp: 450 mL, Rfl: 3   ferrous sulfate 325 (65 FE) MG EC tablet, TAKE 1 TABLET BY MOUTH TWICE A DAY (Patient not taking: Reported on 08/06/2022),  Disp: 180 tablet, Rfl: 1   FLUoxetine (PROZAC) 20 MG capsule, TAKE 1 CAPSULE (20 MG TOTAL) BY MOUTH DAILY FOR 28 DAYS, THEN 2 CAPSULES (40 MG TOTAL) DAILY., Disp: 180 capsule, Rfl: 2   FLUoxetine (PROZAC) 40 MG capsule, Take 1 capsule (40 mg total) by mouth daily., Disp: 90 capsule, Rfl: 0   fluticasone (FLONASE) 50 MCG/ACT nasal spray, Place 2 sprays into both nostrils daily. (Patient not taking: Reported on 08/06/2022), Disp: 16 g, Rfl: 0   Vitamin D, Ergocalciferol, (DRISDOL) 1.25 MG (50000 UNIT) CAPS capsule, Take 1 capsule (50,000 Units total) by mouth every 7 (seven) days. (Patient not taking: Reported on 08/06/2022), Disp: 12 capsule, Rfl: 0   Medications ordered in this encounter:  Meds ordered this encounter  Medications   azithromycin (ZITHROMAX) 250 MG tablet    Sig: Take 2 tablets on day 1, then 1 tablet daily on days 2 through 5    Dispense:  6 tablet    Refill:  0    Order Specific Question:   Supervising Provider    Answer:   Merrilee Jansky [[id number]]   predniSONE (DELTASONE) 20 MG tablet    Sig: Take 2 tablets (40 mg total) by mouth daily with breakfast.    Dispense:  10 tablet    Refill:  0    Order Specific Question:   Supervising Provider    Answer:   Merrilee Jansky [[id number]]   brompheniramine-pseudoephedrine-DM 30-2-10  MG/5ML syrup    Sig: Take 5 mLs by mouth 4 (four) times daily as needed.    Dispense:  120 mL    Refill:  0    Order Specific Question:   Supervising Provider    Answer:   Merrilee Jansky [[id number]]   benzonatate (TESSALON) 100 MG capsule    Sig: Take 1 capsule (100 mg total) by mouth 3 (three) times daily as needed.    Dispense:  30 capsule    Refill:  0    Order Specific Question:   Supervising Provider    Answer:   Merrilee Jansky [id number]     *If you need refills on other medications prior to your next appointment, please contact your pharmacy*  Follow-Up: Call back or seek an in-person evaluation if the symptoms worsen or if the  condition fails to improve as anticipated.  [facility] [phone]  Other Instructions  Laryngitis  Laryngitis is irritation and swelling (inflammation) of your vocal cords. It causes your voice to sound hoarse and may cause you to lose your voice. Depending on the cause, this condition may go away after a short time or may last for more than 3 weeks. Treatment often involves resting your voice and using medicines to soothe your throat. What are the causes? Laryngitis that lasts for a short time may be caused by: An infection caused by a virus. Lots of talking, yelling, or singing. This is also called vocal strain. An infection caused by bacteria. Laryngitis that lasts for more than 3 weeks can be caused by: Lots of talking, yelling, or singing. An injury to the vocal cords. Acid reflux. Allergies. Sinus infection. Mucus draining from the nose down the throat (postnasal drip). Smoking. Drinking too much alcohol. Breathing in chemicals or dust. Having growths on the vocal cords. What increases the risk? Smoking. Drinking too much alcohol. Having allergies. Breathing in fumes at work. What are the signs or symptoms? A change in your voice. It may sound low and hoarse. Loss of voice. Dry cough. Sore throat. Dry throat. Stuffy nose. How is this treated? Treatment depends on what is causing the laryngitis. Usually, treatment includes: Resting your voice. Using medicines to soothe your throat. If your laryngitis is caused by an infection from bacteria, you may need to take antibiotics. If your laryngitis is caused by a growth on your vocal cords, you may need to have a surgery to remove it. Follow these instructions at home: Medicines Take over-the-counter and prescription medicines only as told by your doctor. If you were prescribed an antibiotic medicine, take it as told by your doctor. Do not stop taking it even if you start to feel better. Use throat  lozenges or sprays to soothe your throat as told by your doctor. General instructions  Talk as little as possible. To do this: Avoid whispering. Write instead of talking. Do this until your voice is back to normal. Rinse your mouth (gargle) with a salt water mixture 3-4 times a day or as needed. To make salt water, dissolve -1 tsp (3-6 g) of salt in 1 cup (237 mL) of warm water. Do not swallow this mixture. Drink enough fluid to keep your pee (urine) pale yellow. Breathe in moist air. Use a humidifier if you live in a dry climate. Do not smoke or use any products that contain nicotine or tobacco. If you need help quitting, ask your doctor. Contact a doctor if: You have a fever. Your pain  is worse. Your symptoms do not get better in 2 weeks. Get help right away if: You cough up blood. You have trouble swallowing. You have trouble breathing. Summary Laryngitis is inflammation of your vocal cords. This condition causes your voice to sound low and hoarse. Rest your voice by talking as little as possible. Also avoid whispering. Get help right away if you have trouble swallowing or breathing or if you cough up blood. This information is not intended to replace advice given to you by your health care provider. Make sure you discuss any questions you have with your health care provider. Document Revised: 01/07/2021 Document Reviewed: 01/07/2021 Elsevier Patient Education  2023 Elsevier Inc.   Acute Bronchitis, Adult  Acute bronchitis is sudden inflammation of the main airways (bronchi) that come off the windpipe (trachea) in the lungs. The swelling causes the airways to get smaller and make more mucus than normal. This can make it hard to breathe and can cause coughing or noisy breathing (wheezing). Acute bronchitis may last several weeks. The cough may last longer. Allergies, asthma, and exposure to smoke may make the condition worse. What are the causes? This condition can be caused by  germs and by substances that irritate the lungs, including: Cold and flu viruses. The most common cause of this condition is the virus that causes the common cold. Bacteria. This is less common. Breathing in substances that irritate the lungs, including: Smoke from cigarettes and other forms of tobacco. Dust and pollen. Fumes from household cleaning products, gases, or burned fuel. Indoor or outdoor air pollution. What increases the risk? The following factors may make you more likely to develop this condition: A weak body's defense system, also called the immune system. A condition that affects your lungs and breathing, such as asthma. What are the signs or symptoms? Common symptoms of this condition include: Coughing. This may bring up clear, yellow, or green mucus from your lungs (sputum). Wheezing. Runny or stuffy nose. Having too much mucus in your lungs (chest congestion). Shortness of breath. Aches and pains, including sore throat or chest. How is this diagnosed? This condition is usually diagnosed based on: Your symptoms and medical history. A physical exam. You may also have other tests, including tests to rule out other conditions, such as pneumonia. These tests include: A test of lung function. Test of a mucus sample to look for the presence of bacteria. Tests to check the oxygen level in your blood. Blood tests. Chest X-ray. How is this treated? Most cases of acute bronchitis clear up over time without treatment. Your health care provider may recommend: Drinking more fluids to help thin your mucus so it is easier to cough up. Taking inhaled medicine (inhaler) to improve air flow in and out of your lungs. Using a vaporizer or a humidifier. These are machines that add water to the air to help you breathe better. Taking a medicine that thins mucus and clears congestion (expectorant). Taking a medicine that prevents or stops coughing (cough suppressant). It is not common  to take an antibiotic medicine for this condition. Follow these instructions at home:  Take over-the-counter and prescription medicines only as told by your health care provider. Use an inhaler, vaporizer, or humidifier as told by your health care provider. Take two teaspoons (10 mL) of honey at bedtime to lessen coughing at night. Drink enough fluid to keep your urine pale yellow. Do not use any products that contain nicotine or tobacco. These products include cigarettes, chewing tobacco,  and vaping devices, such as e-cigarettes. If you need help quitting, ask your health care provider. Get plenty of rest. Return to your normal activities as told by your health care provider. Ask your health care provider what activities are safe for you. Keep all follow-up visits. This is important. How is this prevented? To lower your risk of getting this condition again: Wash your hands often with soap and water for at least 20 seconds. If soap and water are not available, use hand sanitizer. Avoid contact with people who have cold symptoms. Try not to touch your mouth, nose, or eyes with your hands. Avoid breathing in smoke or chemical fumes. Breathing smoke or chemical fumes will make your condition worse. Get the flu shot every year. Contact a health care provider if: Your symptoms do not improve after 2 weeks. You have trouble coughing up the mucus. Your cough keeps you awake at night. You have a fever. Get help right away if you: Cough up blood. Feel pain in your chest. Have severe shortness of breath. Faint or keep feeling like you are going to faint. Have a severe headache. Have a fever or chills that get worse. These symptoms may represent a serious problem that is an emergency. Do not wait to see if the symptoms will go away. Get medical help right away. Call your local emergency services (911 in the [place] Do not drive yourself to the hospital. Summary Acute bronchitis is inflammation of  the main airways (bronchi) that come off the windpipe (trachea) in the lungs. The swelling causes the airways to get smaller and make more mucus than normal. Drinking more fluids can help thin your mucus so it is easier to cough up. Take over-the-counter and prescription medicines only as told by your health care provider. Do not use any products that contain nicotine or tobacco. These products include cigarettes, chewing tobacco, and vaping devices, such as e-cigarettes. If you need help quitting, ask your health care provider. Contact a health care provider if your symptoms do not improve after 2 weeks. This information is not intended to replace advice given to you by your health care provider. Make sure you discuss any questions you have with your health care provider. Document Revised: 01/30/2022 Document Reviewed: 02/20/2021 Elsevier Patient Education  [place].    If you have been instructed to have an in-person evaluation today at a local Urgent Care facility, please use the link below. It will take you to a list of all of our available [facility], including address, phone number and hours of operation. Please do not delay care.  [facility]  If you or a family member do not have a primary care provider, use the link below to schedule a visit and establish care. When you choose a [facility] primary care physician or advanced practice provider, you gain a long-term partner in health. Find a Primary Care Provider  Learn more about [facility]'s in-office and virtual care options: [facility]

## 2022-09-01 NOTE — Progress Notes (Signed)
Virtual Visit Consent   Dorothy Whitaker, you are scheduled for a virtual visit with a [facility] provider today. Just as with appointments in the office, your consent must be obtained to participate. Your consent will be active for this visit and any virtual visit you may have with one of our providers in the next 365 days. If you have a MyChart account, a copy of this consent can be sent to you electronically.  As this is a virtual visit, video technology does not allow for your provider to perform a traditional examination. This may limit your provider's ability to fully assess your condition. If your provider identifies any concerns that need to be evaluated in person or the need to arrange testing (such as labs, EKG, etc.), we will make arrangements to do so. Although advances in technology are sophisticated, we cannot ensure that it will always work on either your end or our end. If the connection with a video visit is poor, the visit may have to be switched to a telephone visit. With either a video or telephone visit, we are not always able to ensure that we have a secure connection.  By engaging in this virtual visit, you consent to the provision of healthcare and authorize for your insurance to be billed (if applicable) for the services provided during this visit. Depending on your insurance coverage, you may receive a charge related to this service.  I need to obtain your verbal consent now. Are you willing to proceed with your visit today? [name] has provided verbal consent on 09/01/2022 for a virtual visit (video or telephone). Margaretann Loveless, PA-C  Date: 09/01/2022 2:01 PM  Virtual Visit via Video Note   I, Margaretann Loveless, connected with  [name]  ([id number], 07-29-1996) on 09/01/22 at  2:00 PM EDT by a video-enabled telemedicine application and verified that I am speaking with the correct person using two identifiers.  Location: Patient: Virtual Visit  Location Patient: Home Provider: Virtual Visit Location Provider: Home Office   I discussed the limitations of evaluation and management by telemedicine and the availability of in person appointments. The patient expressed understanding and agreed to proceed.    History of Present Illness: Dorothy Whitaker is a 26 y.o. who identifies as a female who was assigned female at birth, and is being seen today for URI symptoms.  HPI: URI  This is a new problem. The current episode started 1 to 4 weeks ago. The problem has been gradually worsening. There has been no fever. Associated symptoms include chest pain (from coughing), congestion, coughing, headaches and rhinorrhea (and post nasal drainage). Pertinent negatives include no diarrhea, ear pain, neck pain, plugged ear sensation, sinus pain, sore throat or vomiting. Associated symptoms comments: Hoarse voice. She has tried increased fluids (over the counter cold and flu combo medications) for the symptoms. The treatment provided no relief.    At home covid 19 testing has been negative   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   Panic attack 06/26/2021   Symptomatic anemia 11/15/2019   Heavy menstrual bleeding 11/15/2019   Asthma    Acanthosis nigricans 09/21/2015   Body mass index (BMI) of 36.0-36.9 in adult 07/17/2015    Allergies: No Known Allergies Medications:  Current Outpatient Medications:    azithromycin (ZITHROMAX) 250 MG tablet, Take 2 tablets on day 1, then 1 tablet  daily on days 2 through 5, Disp: 6 tablet, Rfl: 0   benzonatate (TESSALON) 100 MG capsule, Take 1 capsule (100 mg total) by mouth 3 (three) times daily as needed., Disp: 30 capsule, Rfl: 0   brompheniramine-pseudoephedrine-DM 30-2-10 MG/5ML syrup, Take 5 mLs by mouth 4 (four) times daily as needed., Disp: 120 mL, Rfl: 0   predniSONE (DELTASONE) 20 MG tablet, Take 2  tablets (40 mg total) by mouth daily with breakfast., Disp: 10 tablet, Rfl: 0   albuterol (VENTOLIN HFA) 108 (90 Base) MCG/ACT inhaler, Inhale 2 puffs into the lungs every 6 (six) hours as needed for wheezing or shortness of breath., Disp: 8 g, Rfl: 0   azelastine (ASTELIN) 0.1 % nasal spray, Place 1 spray into both nostrils 2 (two) times daily. Use in each nostril as directed, Disp: 30 mL, Rfl: 12   clonazePAM (KLONOPIN) 0.5 MG tablet, Take 1 tablet (0.5 mg total) by mouth 2 (two) times daily as needed for anxiety., Disp: 20 tablet, Rfl: 0   ferrous sulfate 300 (60 Fe) MG/5ML syrup, Take 5 mLs (300 mg total) by mouth daily., Disp: 450 mL, Rfl: 3   ferrous sulfate 325 (65 FE) MG EC tablet, TAKE 1 TABLET BY MOUTH TWICE A DAY (Patient not taking: Reported on 08/06/2022), Disp: 180 tablet, Rfl: 1   FLUoxetine (PROZAC) 20 MG capsule, TAKE 1 CAPSULE (20 MG TOTAL) BY MOUTH DAILY FOR 28 DAYS, THEN 2 CAPSULES (40 MG TOTAL) DAILY., Disp: 180 capsule, Rfl: 2   FLUoxetine (PROZAC) 40 MG capsule, Take 1 capsule (40 mg total) by mouth daily., Disp: 90 capsule, Rfl: 0   fluticasone (FLONASE) 50 MCG/ACT nasal spray, Place 2 sprays into both nostrils daily. (Patient not taking: Reported on 08/06/2022), Disp: 16 g, Rfl: 0   Vitamin D, Ergocalciferol, (DRISDOL) 1.25 MG (50000 UNIT) CAPS capsule, Take 1 capsule (50,000 Units total) by mouth every 7 (seven) days. (Patient not taking: Reported on 08/06/2022), Disp: 12 capsule, Rfl: 0  Observations/Objective: Patient is well-developed, well-nourished in no acute distress.  Resting comfortably at home.  Head is normocephalic, atraumatic.  No labored breathing.  Speech is clear and coherent with logical content.  Patient is alert and oriented at baseline.  Hoarse voice  Assessment and Plan: 1. Acute bacterial bronchitis - azithromycin (ZITHROMAX) 250 MG tablet; Take 2 tablets on day 1, then 1 tablet daily on days 2 through 5  Dispense: 6 tablet; Refill: 0 - predniSONE  (DELTASONE) 20 MG tablet; Take 2 tablets (40 mg total) by mouth daily with breakfast.  Dispense: 10 tablet; Refill: 0 - brompheniramine-pseudoephedrine-DM 30-2-10 MG/5ML syrup; Take 5 mLs by mouth 4 (four) times daily as needed.  Dispense: 120 mL; Refill: 0 - benzonatate (TESSALON) 100 MG capsule; Take 1 capsule (100 mg total) by mouth 3 (three) times daily as needed.  Dispense: 30 capsule; Refill: 0  2. Laryngitis - predniSONE (DELTASONE) 20 MG tablet; Take 2 tablets (40 mg total) by mouth daily with breakfast.  Dispense: 10 tablet; Refill: 0  - Worsening over a week despite OTC medications - Will treat with Z-pack, Prednisone, Bromfed DM and tessalon perles - Can continue Mucinex  - Push fluids.  - Rest.  - Steam and humidifier can help - Seek in person evaluation if worsening or symptoms fail to improve    Follow Up Instructions: I discussed the assessment and treatment plan with the patient. The patient was provided an opportunity to ask questions and all were answered. The patient agreed with the plan  and demonstrated an understanding of the instructions.  A copy of instructions were sent to the patient via MyChart unless otherwise noted below.    The patient was advised to call back or seek an in-person evaluation if the symptoms worsen or if the condition fails to improve as anticipated.  Time:  I spent 10 minutes with the patient via telehealth technology discussing the above problems/concerns.    Margaretann Loveless, PA-C

## 2022-09-09 ENCOUNTER — Telehealth: Payer: Self-pay | Admitting: Registered Nurse

## 2022-09-09 ENCOUNTER — Ambulatory Visit: Payer: Self-pay | Admitting: Family Medicine

## 2022-09-09 NOTE — Telephone Encounter (Signed)
Pt was a no show for a NP app with Dr. Grandville Silos on 09/09/22, I sent a letter.

## 2022-09-09 NOTE — Telephone Encounter (Signed)
Pt is established at [facility]. Will no RS at [facility].

## 2022-09-17 ENCOUNTER — Encounter: Payer: Medicaid Other | Admitting: Family Medicine

## 2022-12-02 ENCOUNTER — Telehealth: Payer: Self-pay | Admitting: Nurse Practitioner

## 2022-12-02 DIAGNOSIS — T7840XA Allergy, unspecified, initial encounter: Secondary | ICD-10-CM

## 2022-12-02 DIAGNOSIS — J4520 Mild intermittent asthma, uncomplicated: Secondary | ICD-10-CM

## 2022-12-02 MED ORDER — FLUTICASONE PROPIONATE 50 MCG/ACT NA SUSP: 2.0000 | Freq: Every day | NASAL | 6 refills | Status: DC

## 2022-12-02 MED ORDER — ALBUTEROL SULFATE HFA 108 (90 BASE) MCG/ACT IN AERS: 2.0000 | INHALATION_SPRAY | Freq: Four times a day (QID) | RESPIRATORY_TRACT | 0 refills | Status: DC | PRN

## 2022-12-02 NOTE — Progress Notes (Signed)
Virtual Visit Consent   Dorothy Whitaker, you are scheduled for a virtual visit with a [facility] provider today. Just as with appointments in the office, your consent must be obtained to participate. Your consent will be active for this visit and any virtual visit you may have with one of our providers in the next 365 days. If you have a MyChart account, a copy of this consent can be sent to you electronically.  As this is a virtual visit, video technology does not allow for your provider to perform a traditional examination. This may limit your provider's ability to fully assess your condition. If your provider identifies any concerns that need to be evaluated in person or the need to arrange testing (such as labs, EKG, etc.), we will make arrangements to do so. Although advances in technology are sophisticated, we cannot ensure that it will always work on either your end or our end. If the connection with a video visit is poor, the visit may have to be switched to a telephone visit. With either a video or telephone visit, we are not always able to ensure that we have a secure connection.  By engaging in this virtual visit, you consent to the provision of healthcare and authorize for your insurance to be billed (if applicable) for the services provided during this visit. Depending on your insurance coverage, you may receive a charge related to this service.  I need to obtain your verbal consent now. Are you willing to proceed with your visit today? Dorothy Whitaker has provided verbal consent on 12/02/2022 for a virtual visit (video or telephone). Apolonio Schneiders, FNP  Date: 12/02/2022 5:43 PM  Virtual Visit via Video Note   I, Apolonio Schneiders, connected with  Dorothy Whitaker  ([id number], 1996/03/01) on 12/02/22 at  6:00 PM EST by a video-enabled telemedicine application and verified that I am speaking with the correct person using two identifiers.  Location: Patient: Virtual Visit Location Patient:  Home Provider: Virtual Visit Location Provider: Home Office   I discussed the limitations of evaluation and management by telemedicine and the availability of in person appointments. The patient expressed understanding and agreed to proceed.    History of Present Illness: Dorothy Whitaker is a 27 y.o. who identifies as a female who was assigned female at birth, and is being seen today for complaints of worsening allergie for the past week.  She has had a headache and sinus pressure in her forehead and into her eyes.  She has been using over the counter medications for relief with good success   She has a history of asthma typically has exacerbations with extreme weather changes.   She is having improvement with her nasal spray and is in need of a new inhaler   Treated for bronchitis in October of 2023    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   Panic attack 06/26/2021   Symptomatic anemia 11/15/2019   Heavy menstrual bleeding 11/15/2019   Asthma    Acanthosis nigricans 09/21/2015   Body mass index (BMI) of 36.0-36.9 in adult 07/17/2015    Allergies: No Known Allergies Medications:  Current Outpatient Medications:    albuterol (VENTOLIN HFA) 108 (90 Base) MCG/ACT inhaler, Inhale 2 puffs into the lungs every 6 (six) hours as needed for wheezing or shortness of breath., Disp: 8 g, Rfl: 0   azelastine (ASTELIN) 0.1 %  nasal spray, Place 1 spray into both nostrils 2 (two) times daily. Use in each nostril as directed, Disp: 30 mL, Rfl: 12   benzonatate (TESSALON) 100 MG capsule, Take 1 capsule (100 mg total) by mouth 3 (three) times daily as needed., Disp: 30 capsule, Rfl: 0   brompheniramine-pseudoephedrine-DM 30-2-10 MG/5ML syrup, Take 5 mLs by mouth 4 (four) times daily as needed., Disp: 120 mL, Rfl: 0   clonazePAM (KLONOPIN) 0.5 MG tablet, Take 1 tablet (0.5 mg total) by mouth 2  (two) times daily as needed for anxiety., Disp: 20 tablet, Rfl: 0   ferrous sulfate 300 (60 Fe) MG/5ML syrup, Take 5 mLs (300 mg total) by mouth daily., Disp: 450 mL, Rfl: 3   ferrous sulfate 325 (65 FE) MG EC tablet, TAKE 1 TABLET BY MOUTH TWICE A DAY (Patient not taking: Reported on 08/06/2022), Disp: 180 tablet, Rfl: 1   FLUoxetine (PROZAC) 20 MG capsule, TAKE 1 CAPSULE (20 MG TOTAL) BY MOUTH DAILY FOR 28 DAYS, THEN 2 CAPSULES (40 MG TOTAL) DAILY., Disp: 180 capsule, Rfl: 2   FLUoxetine (PROZAC) 40 MG capsule, Take 1 capsule (40 mg total) by mouth daily., Disp: 90 capsule, Rfl: 0   fluticasone (FLONASE) 50 MCG/ACT nasal spray, Place 2 sprays into both nostrils daily. (Patient not taking: Reported on 08/06/2022), Disp: 16 g, Rfl: 0   predniSONE (DELTASONE) 20 MG tablet, Take 2 tablets (40 mg total) by mouth daily with breakfast., Disp: 10 tablet, Rfl: 0   Vitamin D, Ergocalciferol, (DRISDOL) 1.25 MG (50000 UNIT) CAPS capsule, Take 1 capsule (50,000 Units total) by mouth every 7 (seven) days. (Patient not taking: Reported on 08/06/2022), Disp: 12 capsule, Rfl: 0  Observations/Objective: Patient is well-developed, well-nourished in no acute distress.  Resting comfortably  at home.  Head is normocephalic, atraumatic.  No labored breathing.  Speech is clear and coherent with logical content.  Patient is alert and oriented at baseline.    Assessment and Plan: 1. Allergy, initial encounter  - fluticasone (FLONASE) 50 MCG/ACT nasal spray; Place 2 sprays into both nostrils daily.  Dispense: 16 g; Refill: 6  2. Mild intermittent asthma, unspecified whether complicated  - albuterol (VENTOLIN HFA) 108 (90 Base) MCG/ACT inhaler; Inhale 2 puffs into the lungs every 6 (six) hours as needed for wheezing or shortness of breath.  Dispense: 8 g; Refill: 0    Follow Up Instructions: I discussed the assessment and treatment plan with the patient. The patient was provided an opportunity to ask questions and  all were answered. The patient agreed with the plan and demonstrated an understanding of the instructions.  A copy of instructions were sent to the patient via MyChart unless otherwise noted below.    The patient was advised to call back or seek an in-person evaluation if the symptoms worsen or if the condition fails to improve as anticipated.  Time:  I spent 10 minutes with the patient via telehealth technology discussing the above problems/concerns.    Apolonio Schneiders, FNP

## 2022-12-04 NOTE — Progress Notes (Signed)
This encounter was created in error - please disregard.

## 2022-12-20 ENCOUNTER — Encounter (HOSPITAL_BASED_OUTPATIENT_CLINIC_OR_DEPARTMENT_OTHER): Payer: Self-pay | Admitting: Emergency Medicine

## 2022-12-20 ENCOUNTER — Other Ambulatory Visit: Payer: Self-pay

## 2022-12-20 ENCOUNTER — Emergency Department (HOSPITAL_BASED_OUTPATIENT_CLINIC_OR_DEPARTMENT_OTHER)
Admission: EM | Admit: 2022-12-20 | Discharge: 2022-12-21 | Disposition: A | Discharge status: Home / Self Care | Payer: Self-pay | Attending: Emergency Medicine | Admitting: Emergency Medicine

## 2022-12-20 DIAGNOSIS — Z202 Contact with and (suspected) exposure to infections with a predominantly sexual mode of transmission: Secondary | ICD-10-CM | POA: Insufficient documentation

## 2022-12-20 DIAGNOSIS — R7309 Other abnormal glucose: Secondary | ICD-10-CM | POA: Insufficient documentation

## 2022-12-20 DIAGNOSIS — R81 Glycosuria: Secondary | ICD-10-CM | POA: Insufficient documentation

## 2022-12-20 NOTE — ED Triage Notes (Signed)
Requesting std testing Recent partner developed herpes type lesion and is requesting testing Denies symptoms at this time

## 2022-12-20 NOTE — ED Notes (Signed)
Pt self swabbed for gc and wet prep.

## 2022-12-20 NOTE — ED Provider Notes (Signed)
[facility] Provider Note   CSN: [id number] Arrival date & time: 12/20/22  2100     History Chief Complaint  Patient presents with   Exposure to STD    Dorothy Whitaker is a 26 y.o. female otherwise healthy presents emerged from today for evaluation of possible STD exposure.  Patient reports that she had a partner call her and tell her that he had a cold sore on his lip and thinks it is from her.  The patient reports that the other person mentions that he has had cold sores before but thinks this was from her.  She reports that she has had cold sores in her past but it has been years since she has had another 1.  She denies any lesions on her vagina as well.  She denies any vaginal discharge, vaginal bleeding, dysuria, hematuria.  Denies any sore throat or any fevers.   Exposure to STD Pertinent negatives include no abdominal pain.       Home Medications Prior to Admission medications   Medication Sig Start Date End Date Taking? Authorizing Provider  albuterol (VENTOLIN HFA) 108 (90 Base) MCG/ACT inhaler Inhale 2 puffs into the lungs every 6 (six) hours as needed for wheezing or shortness of breath. 12/02/22   Apolonio Schneiders, FNP  azelastine (ASTELIN) 0.1 % nasal spray Place 1 spray into both nostrils 2 (two) times daily. Use in each nostril as directed 07/02/21   Maximiano Coss, NP  benzonatate (TESSALON) 100 MG capsule Take 1 capsule (100 mg total) by mouth 3 (three) times daily as needed. 09/01/22   [name], PA-C  brompheniramine-pseudoephedrine-DM 30-2-10 MG/5ML syrup Take 5 mLs by mouth 4 (four) times daily as needed. 09/01/22   [name], PA-C  clonazePAM (KLONOPIN) 0.5 MG tablet Take 1 tablet (0.5 mg total) by mouth 2 (two) times daily as needed for anxiety. 08/06/22   Wendie Agreste, MD  ferrous sulfate 300 (60 Fe) MG/5ML syrup Take 5 mLs (300 mg total) by mouth daily. 08/08/22   Wendie Agreste, MD  ferrous  sulfate 325 (65 FE) MG EC tablet TAKE 1 TABLET BY MOUTH TWICE A DAY Patient not taking: Reported on 08/06/2022 09/30/21   Maximiano Coss, NP  FLUoxetine (PROZAC) 20 MG capsule TAKE 1 CAPSULE (20 MG TOTAL) BY MOUTH DAILY FOR 28 DAYS, THEN 2 CAPSULES (40 MG TOTAL) DAILY. 08/02/21 02/12/22  Maximiano Coss, NP  FLUoxetine (PROZAC) 40 MG capsule Take 1 capsule (40 mg total) by mouth daily. 08/06/22   Wendie Agreste, MD  fluticasone (FLONASE) 50 MCG/ACT nasal spray Place 2 sprays into both nostrils daily. Patient not taking: Reported on 08/06/2022 06/18/21   Couture, Cortni S, PA-C  fluticasone (FLONASE) 50 MCG/ACT nasal spray Place 2 sprays into both nostrils daily. 12/02/22   Apolonio Schneiders, FNP  predniSONE (DELTASONE) 20 MG tablet Take 2 tablets (40 mg total) by mouth daily with breakfast. 09/01/22   [name], PA-C  Vitamin D, Ergocalciferol, (DRISDOL) 1.25 MG (50000 UNIT) CAPS capsule Take 1 capsule (50,000 Units total) by mouth every 7 (seven) days. Patient not taking: Reported on 08/06/2022 07/04/21   Maximiano Coss, NP      Allergies    Patient has no known allergies.    Review of Systems   Review of Systems  Constitutional:  Negative for chills and fever.  HENT:  Negative for sore throat.   Gastrointestinal:  Negative for abdominal pain, nausea and vomiting.  Genitourinary:  Negative for dysuria, genital sores, hematuria, pelvic pain, vaginal bleeding, vaginal discharge and vaginal pain.  Musculoskeletal:  Negative for joint swelling.    Physical Exam Updated Vital Signs BP (!) 153/116 (BP Location: Right Arm)   Pulse 86   Temp 98.1 F (36.7 C) (Oral)   Resp 18   LMP 12/04/2022   SpO2 100%  Physical Exam Vitals and nursing note reviewed.  Constitutional:      General: She is not in acute distress.    Appearance: Normal appearance. She is not ill-appearing or toxic-appearing.  Eyes:     General: No scleral icterus. Pulmonary:     Effort: Pulmonary effort is normal. No  respiratory distress.  Genitourinary:    Comments: Patient is asymptomatic Skin:    General: Skin is dry.     Findings: No rash.  Neurological:     General: No focal deficit present.     Mental Status: She is alert. Mental status is at baseline.  Psychiatric:        Mood and Affect: Mood normal.     ED Results / Procedures / Treatments   Labs (all labs ordered are listed, but only abnormal results are displayed) Labs Reviewed  WET PREP, GENITAL  RPR  HIV ANTIBODY (ROUTINE TESTING W REFLEX)  URINALYSIS, ROUTINE W REFLEX MICROSCOPIC  PREGNANCY, URINE  GC/CHLAMYDIA PROBE AMP ([facility]) NOT AT [facility]    EKG None  Radiology No results found.  Procedures Procedures   Medications Ordered in ED Medications - No data to display  ED Course/ Medical Decision Making/ A&P                           Medical Decision Making Amount and/or Complexity of Data Reviewed Labs: ordered.   27 year old female presents emerged from today for evaluation for STD testing.  Vital signs show elevated blood pressure at 153/116, afebrile, normal pulse rate, satting on room air without increased work of breathing.  Physical exam as noted above.  GU exam not performed as patient is asymptomatic.  I independently reviewed and interpreted the patient's labs.  Wet prep***.  Urinalysis***.  Pregnancy***.  I instructed the patient to follow-up with her labs online.  I discussed with her that there is no herpes test as she does not have any active lesions.  We discussed safe sex in the meantime.  Patient being discharged.   Final Clinical Impression(s) / ED Diagnoses Final diagnoses:  None    Rx / DC Orders ED Discharge Orders     None

## 2022-12-21 ENCOUNTER — Telehealth: Payer: Self-pay

## 2022-12-21 LAB — URINALYSIS, ROUTINE W REFLEX MICROSCOPIC
Bacteria, UA: NONE SEEN
Bilirubin Urine: NEGATIVE
Glucose, UA: >1000 mg/dL — AB
Hgb urine dipstick: NEGATIVE
Ketones, ur: 40 mg/dL — AB
Leukocytes,Ua: NEGATIVE
Nitrite: NEGATIVE
Protein, ur: 30 mg/dL — AB
Specific Gravity, Urine: 1.034 — ABNORMAL HIGH (ref 1.005–1.030)
pH: 5.5 (ref 5.0–8.0)

## 2022-12-21 LAB — PREGNANCY, URINE: Preg Test, Ur: NEGATIVE

## 2022-12-21 LAB — HIV ANTIBODY (ROUTINE TESTING W REFLEX): HIV Screen 4th Generation wRfx: NONREACTIVE

## 2022-12-21 LAB — WET PREP, GENITAL
Clue Cells Wet Prep HPF POC: NONE SEEN
Sperm: NONE SEEN
Trich, Wet Prep: NONE SEEN
WBC, Wet Prep HPF POC: <10 (ref <10)
Yeast Wet Prep HPF POC: NONE SEEN

## 2022-12-21 LAB — CBG MONITORING, ED: Glucose-Capillary: 222 mg/dL — ABNORMAL HIGH (ref 70–99)

## 2022-12-21 MED ORDER — LANCET DEVICE MISC: 1.0000 | Freq: Three times a day (TID) | 0 refills | Status: AC

## 2022-12-21 MED ORDER — BLOOD GLUCOSE TEST VI STRP: 1.0000 | ORAL_STRIP | Freq: Three times a day (TID) | 0 refills | Status: AC

## 2022-12-21 MED ORDER — BLOOD GLUCOSE MONITORING SUPPL DEVI: 1.0000 | Freq: Three times a day (TID) | 0 refills | Status: AC

## 2022-12-21 MED ORDER — METFORMIN HCL 500 MG PO TABS: 500.0000 mg | ORAL_TABLET | Freq: Every day | ORAL | 1 refills | Status: AC

## 2022-12-21 MED ORDER — LANCETS MISC. MISC: 1.0000 | Freq: Three times a day (TID) | 0 refills | Status: AC

## 2022-12-21 NOTE — ED Notes (Signed)
Provider at bedside

## 2022-12-21 NOTE — Telephone Encounter (Signed)
Called patient for follow up on consult, new onset diabetes. Patient has not picked up glucose meter or glucophage yet. She stated the doctor said she could wait until she saw her primary she is calling Monday to make an appointment. Discussed hypoglycemia S/S and importance of having protein and carbohydrates on hand in case of events.

## 2022-12-21 NOTE — Discharge Instructions (Addendum)
Please follow up with your MyChart for the results of your STD testing. I would like for you to follow up with a PCP given your elevated sugar and glucose in your urine. I am starting you on Metformin and have prescribed a blood glucose monitoring shift. Please make sure you are checking a blood sugar at least once a day. Please make sure you are following up with your PCP. Please make sure you call on Monday to schedule an appointment. If you have any concerns, new or worsening symptoms, please return to the nearest ER for re-evlaution.   Contact a doctor if: Your blood sugar is at or above 240 mg/dL (13.3 mmol/L) for 2 days in a row. You have been sick for 2 days or more, and you are not getting better. You have had a fever for 2 days or more, and you are not getting better. You have any of these problems for more than 6 hours: You cannot eat or drink. You feel like you may vomit. You vomit. You have watery poop (diarrhea). Get help right away if: Your blood sugar is lower than 54 mg/dL (3 mmol/L). You feel mixed up (confused). You have trouble thinking clearly. You have trouble breathing. You have medium or large ketone levels in your pee. These symptoms may be an emergency. Get help right away. Call your local emergency services (911 in the [place] Do not wait to see if the symptoms will go away. Do not drive yourself to the hospital.

## 2022-12-22 LAB — GC/CHLAMYDIA PROBE AMP (~~LOC~~) NOT AT ARMC
Chlamydia: POSITIVE — AB
Comment: NEGATIVE
Comment: NORMAL
Neisseria Gonorrhea: NEGATIVE

## 2022-12-22 LAB — RPR: RPR Ser Ql: NONREACTIVE

## 2022-12-23 ENCOUNTER — Ambulatory Visit: Payer: Self-pay

## 2022-12-23 ENCOUNTER — Telehealth: Payer: Self-pay | Admitting: Physician Assistant

## 2022-12-23 DIAGNOSIS — A749 Chlamydial infection, unspecified: Secondary | ICD-10-CM

## 2022-12-23 MED ORDER — DOXYCYCLINE HYCLATE 100 MG PO TABS: 100.0000 mg | ORAL_TABLET | Freq: Two times a day (BID) | ORAL | 0 refills | Status: DC

## 2022-12-23 NOTE — Patient Instructions (Signed)
[name], thank you for joining [name], PA-C for today's virtual visit.  While this provider is not your primary care provider (PCP), if your PCP is located in our provider database this encounter information will be shared with them immediately following your visit.   [facility] account gives you access to today's visit and all your visits, tests, and labs performed at [facility] " click here if you don't have a [facility] account or go to [email or web address]  Consent: (Patient) [name] provided verbal consent for this virtual visit at the beginning of the encounter.  Current Medications:  Current Outpatient Medications:    doxycycline (VIBRA-TABS) 100 MG tablet, Take 1 tablet (100 mg total) by mouth 2 (two) times daily., Disp: 14 tablet, Rfl: 0   albuterol (VENTOLIN HFA) 108 (90 Base) MCG/ACT inhaler, Inhale 2 puffs into the lungs every 6 (six) hours as needed for wheezing or shortness of breath., Disp: 8 g, Rfl: 0   azelastine (ASTELIN) 0.1 % nasal spray, Place 1 spray into both nostrils 2 (two) times daily. Use in each nostril as directed, Disp: 30 mL, Rfl: 12   benzonatate (TESSALON) 100 MG capsule, Take 1 capsule (100 mg total) by mouth 3 (three) times daily as needed., Disp: 30 capsule, Rfl: 0   Blood Glucose Monitoring Suppl DEVI, 1 each by Does not apply route in the morning, at noon, and at bedtime. May substitute to any manufacturer covered by patient's insurance., Disp: 1 each, Rfl: 0   brompheniramine-pseudoephedrine-DM 30-2-10 MG/5ML syrup, Take 5 mLs by mouth 4 (four) times daily as needed., Disp: 120 mL, Rfl: 0   clonazePAM (KLONOPIN) 0.5 MG tablet, Take 1 tablet (0.5 mg total) by mouth 2 (two) times daily as needed for anxiety., Disp: 20 tablet, Rfl: 0   ferrous sulfate 300 (60 Fe) MG/5ML syrup, Take 5 mLs (300 mg total) by mouth daily., Disp: 450 mL, Rfl: 3   ferrous sulfate 325 (65 FE) MG EC tablet, TAKE 1  TABLET BY MOUTH TWICE A DAY (Patient not taking: Reported on 08/06/2022), Disp: 180 tablet, Rfl: 1   FLUoxetine (PROZAC) 20 MG capsule, TAKE 1 CAPSULE (20 MG TOTAL) BY MOUTH DAILY FOR 28 DAYS, THEN 2 CAPSULES (40 MG TOTAL) DAILY., Disp: 180 capsule, Rfl: 2   FLUoxetine (PROZAC) 40 MG capsule, Take 1 capsule (40 mg total) by mouth daily., Disp: 90 capsule, Rfl: 0   fluticasone (FLONASE) 50 MCG/ACT nasal spray, Place 2 sprays into both nostrils daily. (Patient not taking: Reported on 08/06/2022), Disp: 16 g, Rfl: 0   fluticasone (FLONASE) 50 MCG/ACT nasal spray, Place 2 sprays into both nostrils daily., Disp: 16 g, Rfl: 6   Glucose Blood (BLOOD GLUCOSE TEST STRIPS) STRP, 1 each by In Vitro route in the morning, at noon, and at bedtime. May substitute to any manufacturer covered by patient's insurance., Disp: 100 strip, Rfl: 0   Lancet Device MISC, 1 each by Does not apply route in the morning, at noon, and at bedtime. May substitute to any manufacturer covered by patient's insurance., Disp: 1 each, Rfl: 0   Lancets Misc. MISC, 1 each by Does not apply route in the morning, at noon, and at bedtime. May substitute to any manufacturer covered by patient's insurance., Disp: 100 each, Rfl: 0   metFORMIN (GLUCOPHAGE) 500 MG tablet, Take 1 tablet (500 mg total) by mouth daily with breakfast., Disp: 30 tablet, Rfl: 1   predniSONE (DELTASONE) 20 MG tablet, Take 2  tablets (40 mg total) by mouth daily with breakfast., Disp: 10 tablet, Rfl: 0   Vitamin D, Ergocalciferol, (DRISDOL) 1.25 MG (50000 UNIT) CAPS capsule, Take 1 capsule (50,000 Units total) by mouth every 7 (seven) days. (Patient not taking: Reported on 08/06/2022), Disp: 12 capsule, Rfl: 0   Medications ordered in this encounter:  Meds ordered this encounter  Medications   doxycycline (VIBRA-TABS) 100 MG tablet    Sig: Take 1 tablet (100 mg total) by mouth 2 (two) times daily.    Dispense:  14 tablet    Refill:  0    Order Specific Question:    Supervising Provider    Answer:   Chase Picket [id number]     *If you need refills on other medications prior to your next appointment, please contact your pharmacy*  Follow-Up: Call back or seek an in-person evaluation if the symptoms worsen or if the condition fails to improve as anticipated.  [facility] [phone]  Other Instructions  Chlamydia, Female  Chlamydia is a sexually transmitted infection (STI). This infection spreads through sexual contact. Chlamydia can occur in different areas of the body, including: The urethra. This is the part of the body that drains urine from the bladder. The cervix. This is the lowest part of the uterus. The throat. The rectum. This condition is not difficult to treat. However, if left untreated, chlamydia can lead to more serious health problems, including pelvic inflammatory disease (PID). PID can increase your risk of being unable to have children. In pregnant women, untreated chlamydia can cause serious complications during pregnancy or health problems for the baby after delivery. What are the causes? This condition is caused by a bacteria called Chlamydia trachomatis. The bacteria are spread from an infected partner during sexual activity. Chlamydia can spread through contact with the genitals, mouth, or rectum. What increases the risk? The following factors may make you more likely to develop this condition: Not using a condom the right way or not using a condom every time you have sex. Having a new sex partner or having more than one sex partner. Being sexually active before age 80. What are the signs or symptoms? In some cases, there are no symptoms, especially early in the infection. If symptoms develop, they may include: Urinating often, or a burning feeling during urination. Redness, soreness, or swelling of the vagina or rectum. Discharge coming from the vagina or rectum. Pain in the abdomen. Pain during  sex. Bleeding between menstrual periods or irregular periods. How is this diagnosed? This condition may be diagnosed with: Urine tests. Swab tests. Depending on your symptoms, your health care provider may use a cotton swab to collect a fluid sample from your vagina, rectum, nose, or throat to test for the bacteria. A pelvic exam. How is this treated? This condition is treated with antibiotic medicines. Follow these instructions at home: Sexual activity Tell your sex partner or partners about your infection. These include any partners for oral, anal, or vaginal sex that you have had within 60 days of when your symptoms started. Sex partners should also be treated, even if they have no signs of the infection. Do not have sex until you and your sex partners have completed treatment and your health care provider says it is okay. If your health care provider prescribed you a single-dose medicine as treatment, wait at least 7 days after taking the medicine before having sex. General instructions Take over-the-counter and prescription medicines as told by your  health care provider. Finish all antibiotic medicine even when you start to feel better. It is up to you to get your test results. Ask your health care provider, or the department that is doing the test, when your results will be ready. Keep all follow-up visits. This is important. You may need to be tested for infection again 3 months after treatment. How is this prevented? You can lower your risk of getting chlamydia by: Using latex or polyurethane condoms correctly every time you have sex. Not having multiple sex partners. Asking if your sex partner has been tested for STIs and had negative results. Getting regular health screenings to check for STIs. Contact a health care provider if: You develop new symptoms or your symptoms are getting worse. Your symptoms do not get better after treatment. You have a fever or chills. You have pain  during sex. You have irregular menstrual periods, or you have bleeding between periods or after sex. You develop flu-like symptoms, such as night sweats, sore throat, or muscle aches. You are unable to take your antibiotic medicine as prescribed. Summary Chlamydia is a sexually transmitted infection (STI) that is caused by bacteria. This infection spreads through sexual contact. This condition is treated with antibiotic medicines. If left untreated, chlamydia can lead to more serious health problems, including pelvic inflammatory disease (PID). Your sex partners will also need to be treated. Do not have sex until both you and your partner have been treated. Take medicines as directed by your health care provider and keep all follow-up visits to ensure your infection has been completely treated. This information is not intended to replace advice given to you by your health care provider. Make sure you discuss any questions you have with your health care provider. Document Revised: 08/12/2021 Document Reviewed: 08/12/2021 Elsevier Patient Education  [place].    If you have been instructed to have an in-person evaluation today at a local Urgent Care facility, please use the link below. It will take you to a list of all of our available [facility], including address, phone number and hours of operation. Please do not delay care.  [facility]  If you or a family member do not have a primary care provider, use the link below to schedule a visit and establish care. When you choose a [facility] primary care physician or advanced practice provider, you gain a long-term partner in health. Find a Primary Care Provider  Learn more about [facility]'s in-office and virtual care options: [facility]

## 2022-12-23 NOTE — Telephone Encounter (Signed)
Reason for Disposition  Caller requesting routine or non-urgent lab result  Answer Assessment - Initial Assessment Questions 1. REASON FOR CALL or QUESTION: "What is your reason for calling today?" or "How can I best help you?" or "What question do you have that I can help answer?"     Discuss what result of Chlamydia is and seeking clarification. Pt asked a lot of questions and each time asked to call her PCP.  Answer Assessment - Initial Assessment Questions 1. REASON FOR CALL or QUESTION: "What is your reason for calling today?" or "How can I best help you?" or "What question do you have that I can help answer?"     Discuss what result of Chlamydia is and seeking clarification. Pt asked a lot of questions and each time asked to call her PCP.    2. CALLER: Document the source of call. (e.g., laboratory, patient).     pt  Protocols used: Information Only Call - No Triage-A-AH, PCP Call - No Triage-A-AH

## 2022-12-23 NOTE — Telephone Encounter (Signed)
Good morning , The only labs I am seeing are from the ER and this pt has not been seen in our office  recently . Last Visit here was a year ago and no current PCP on file . Mr Dorothy Whitaker has been gone for a year now

## 2022-12-23 NOTE — Progress Notes (Signed)
Virtual Visit Consent   DEDEE SELLIN, you are scheduled for a virtual visit with a [facility] provider today. Just as with appointments in the office, your consent must be obtained to participate. Your consent will be active for this visit and any virtual visit you may have with one of our providers in the next 365 days. If you have a MyChart account, a copy of this consent can be sent to you electronically.  As this is a virtual visit, video technology does not allow for your provider to perform a traditional examination. This may limit your provider's ability to fully assess your condition. If your provider identifies any concerns that need to be evaluated in person or the need to arrange testing (such as labs, EKG, etc.), we will make arrangements to do so. Although advances in technology are sophisticated, we cannot ensure that it will always work on either your end or our end. If the connection with a video visit is poor, the visit may have to be switched to a telephone visit. With either a video or telephone visit, we are not always able to ensure that we have a secure connection.  By engaging in this virtual visit, you consent to the provision of healthcare and authorize for your insurance to be billed (if applicable) for the services provided during this visit. Depending on your insurance coverage, you may receive a charge related to this service.  I need to obtain your verbal consent now. Are you willing to proceed with your visit today? [name] has provided verbal consent on 12/23/2022 for a virtual visit (video or telephone). [name], PA-C  Date: 12/23/2022 12:28 PM  Virtual Visit via Video Note   I, [name], connected with  Dorothy Whitaker  ([id number], Nov 29, 1995) on 12/23/22 at 12:15 PM EST by a video-enabled telemedicine application and verified that I am speaking with the correct person using two identifiers.  Location: Patient: Virtual Visit  Location Patient: Home Provider: Virtual Visit Location Provider: Home Office   I discussed the limitations of evaluation and management by telemedicine and the availability of in person appointments. The patient expressed understanding and agreed to proceed.    History of Present Illness: Dorothy Whitaker is a 27 y.o. who identifies as a female who was assigned female at birth, and is being seen today to discuss test results. Was seen in ER on 12/20/22 for STI screen. 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 GC/Chlamydia resulted today and is positive for Chlamydia. Needs treatment.   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   Panic attack 06/26/2021   Symptomatic anemia 11/15/2019   Heavy menstrual bleeding 11/15/2019   Asthma    Acanthosis nigricans 09/21/2015   Body mass index (BMI) of 36.0-36.9 in adult 07/17/2015    Allergies: No Known Allergies Medications:  Current Outpatient Medications:    doxycycline (VIBRA-TABS) 100 MG tablet, Take 1 tablet (100 mg total) by mouth 2 (two) times daily., Disp: 14 tablet, Rfl: 0   albuterol (VENTOLIN HFA) 108 (90 Base) MCG/ACT inhaler, Inhale 2 puffs into the lungs every 6 (six) hours as needed for wheezing or shortness of breath., Disp: 8 g, Rfl: 0   azelastine (ASTELIN) 0.1 % nasal spray, Place 1 spray into both nostrils 2 (two) times daily. Use in each nostril as directed,  Disp: 30 mL, Rfl: 12   benzonatate (TESSALON) 100 MG capsule, Take 1 capsule (100 mg total) by mouth 3 (three) times daily as needed., Disp: 30 capsule, Rfl: 0   Blood Glucose Monitoring Suppl DEVI, 1 each by Does not apply route in the morning, at noon, and at bedtime. May substitute to any manufacturer covered by patient's insurance., Disp: 1 each, Rfl: 0   brompheniramine-pseudoephedrine-DM 30-2-10 MG/5ML syrup, Take 5 mLs by mouth 4  (four) times daily as needed., Disp: 120 mL, Rfl: 0   clonazePAM (KLONOPIN) 0.5 MG tablet, Take 1 tablet (0.5 mg total) by mouth 2 (two) times daily as needed for anxiety., Disp: 20 tablet, Rfl: 0   ferrous sulfate 300 (60 Fe) MG/5ML syrup, Take 5 mLs (300 mg total) by mouth daily., Disp: 450 mL, Rfl: 3   ferrous sulfate 325 (65 FE) MG EC tablet, TAKE 1 TABLET BY MOUTH TWICE A DAY (Patient not taking: Reported on 08/06/2022), Disp: 180 tablet, Rfl: 1   FLUoxetine (PROZAC) 20 MG capsule, TAKE 1 CAPSULE (20 MG TOTAL) BY MOUTH DAILY FOR 28 DAYS, THEN 2 CAPSULES (40 MG TOTAL) DAILY., Disp: 180 capsule, Rfl: 2   FLUoxetine (PROZAC) 40 MG capsule, Take 1 capsule (40 mg total) by mouth daily., Disp: 90 capsule, Rfl: 0   fluticasone (FLONASE) 50 MCG/ACT nasal spray, Place 2 sprays into both nostrils daily. (Patient not taking: Reported on 08/06/2022), Disp: 16 g, Rfl: 0   fluticasone (FLONASE) 50 MCG/ACT nasal spray, Place 2 sprays into both nostrils daily., Disp: 16 g, Rfl: 6   Glucose Blood (BLOOD GLUCOSE TEST STRIPS) STRP, 1 each by In Vitro route in the morning, at noon, and at bedtime. May substitute to any manufacturer covered by patient's insurance., Disp: 100 strip, Rfl: 0   Lancet Device MISC, 1 each by Does not apply route in the morning, at noon, and at bedtime. May substitute to any manufacturer covered by patient's insurance., Disp: 1 each, Rfl: 0   Lancets Misc. MISC, 1 each by Does not apply route in the morning, at noon, and at bedtime. May substitute to any manufacturer covered by patient's insurance., Disp: 100 each, Rfl: 0   metFORMIN (GLUCOPHAGE) 500 MG tablet, Take 1 tablet (500 mg total) by mouth daily with breakfast., Disp: 30 tablet, Rfl: 1   predniSONE (DELTASONE) 20 MG tablet, Take 2 tablets (40 mg total) by mouth daily with breakfast., Disp: 10 tablet, Rfl: 0   Vitamin D, Ergocalciferol, (DRISDOL) 1.25 MG (50000 UNIT) CAPS capsule, Take 1 capsule (50,000 Units total) by mouth every 7  (seven) days. (Patient not taking: Reported on 08/06/2022), Disp: 12 capsule, Rfl: 0  Observations/Objective: Patient is well-developed, well-nourished in no acute distress.  Resting comfortably at home.  Head is normocephalic, atraumatic.  No labored breathing.  Speech is clear and coherent with logical content.  Patient is alert and oriented at baseline.    Assessment and Plan: 1. Chlamydia - doxycycline (VIBRA-TABS) 100 MG tablet; Take 1 tablet (100 mg total) by mouth 2 (two) times daily.  Dispense: 14 tablet; Refill: 0  - Treatment for positive Chlamydia testing sent in (doxycycline) - Health department advised by ER per telephone note - Discussed testing for cure 2 weeks after completing antibiotic treatment - Abstain from sexual activity until antibiotic completed - Safe sex practices - Follow up in person if symptoms change or worsen  Follow Up Instructions: I discussed the assessment and treatment plan with the patient. The patient was provided an opportunity  to ask questions and all were answered. The patient agreed with the plan and demonstrated an understanding of the instructions.  A copy of instructions were sent to the patient via MyChart unless otherwise noted below.    The patient was advised to call back or seek an in-person evaluation if the symptoms worsen or if the condition fails to improve as anticipated.  Time:  I spent 15 minutes with the patient via telehealth technology discussing the above problems/concerns.    [name], PA-C

## 2022-12-26 NOTE — Telephone Encounter (Signed)
Pt has a [facility] visit 12/23/22 and had questions answered and meds prescribed.

## 2023-01-09 ENCOUNTER — Telehealth: Payer: Self-pay | Admitting: Physician Assistant

## 2023-01-09 DIAGNOSIS — J069 Acute upper respiratory infection, unspecified: Secondary | ICD-10-CM

## 2023-01-09 NOTE — Progress Notes (Signed)
Virtual Visit Consent   Dorothy Whitaker, you are scheduled for a virtual visit with a [facility] provider today. Just as with appointments in the office, your consent must be obtained to participate. Your consent will be active for this visit and any virtual visit you may have with one of our providers in the next 365 days. If you have a MyChart account, a copy of this consent can be sent to you electronically.  As this is a virtual visit, video technology does not allow for your provider to perform a traditional examination. This may limit your provider's ability to fully assess your condition. If your provider identifies any concerns that need to be evaluated in person or the need to arrange testing (such as labs, EKG, etc.), we will make arrangements to do so. Although advances in technology are sophisticated, we cannot ensure that it will always work on either your end or our end. If the connection with a video visit is poor, the visit may have to be switched to a telephone visit. With either a video or telephone visit, we are not always able to ensure that we have a secure connection.  By engaging in this virtual visit, you consent to the provision of healthcare and authorize for your insurance to be billed (if applicable) for the services provided during this visit. Depending on your insurance coverage, you may receive a charge related to this service.  I need to obtain your verbal consent now. Are you willing to proceed with your visit today? Dorothy Whitaker has provided verbal consent on 01/09/2023 for a virtual visit (video or telephone). [name], PA-C  Date: 01/09/2023 1:07 PM  Virtual Visit via Video Note   I, [name], connected with  Dorothy Whitaker  ([id number], 28-Sep-1996) on 01/09/23 at  1:00 PM EST by a video-enabled telemedicine application and verified that I am speaking with the correct person using two identifiers.  Location: Patient: Virtual Visit  Location Patient: Home Provider: Virtual Visit Location Provider: Home Office   I discussed the limitations of evaluation and management by telemedicine and the availability of in person appointments. The patient expressed understanding and agreed to proceed.    History of Present Illness: Dorothy Whitaker is a 27 y.o. who identifies as a female who was assigned female at birth, and is being seen today for work note. Patient reports she was out of work last week due to a URI. Needs work note to excuse. Was not seen during illness.   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   Panic attack 06/26/2021   Symptomatic anemia 11/15/2019   Heavy menstrual bleeding 11/15/2019   Asthma    Acanthosis nigricans 09/21/2015   Body mass index (BMI) of 36.0-36.9 in adult 07/17/2015    Allergies: No Known Allergies Medications:  Current Outpatient Medications:    albuterol (VENTOLIN HFA) 108 (90 Base) MCG/ACT inhaler, Inhale 2 puffs into the lungs every 6 (six) hours as needed for wheezing or shortness of breath., Disp: 8 g, Rfl: 0   azelastine (ASTELIN) 0.1 % nasal spray, Place 1 spray into both nostrils 2 (two) times daily. Use in each nostril as directed, Disp: 30 mL, Rfl: 12   benzonatate (TESSALON) 100 MG capsule, Take 1 capsule (100 mg total) by mouth 3 (three) times daily as needed., Disp: 30 capsule, Rfl: 0   Blood Glucose Monitoring  Suppl DEVI, 1 each by Does not apply route in the morning, at noon, and at bedtime. May substitute to any manufacturer covered by patient's insurance., Disp: 1 each, Rfl: 0   brompheniramine-pseudoephedrine-DM 30-2-10 MG/5ML syrup, Take 5 mLs by mouth 4 (four) times daily as needed., Disp: 120 mL, Rfl: 0   clonazePAM (KLONOPIN) 0.5 MG tablet, Take 1 tablet (0.5 mg total) by mouth 2 (two) times daily as needed for anxiety., Disp: 20 tablet, Rfl: 0   doxycycline  (VIBRA-TABS) 100 MG tablet, Take 1 tablet (100 mg total) by mouth 2 (two) times daily., Disp: 14 tablet, Rfl: 0   ferrous sulfate 300 (60 Fe) MG/5ML syrup, Take 5 mLs (300 mg total) by mouth daily., Disp: 450 mL, Rfl: 3   ferrous sulfate 325 (65 FE) MG EC tablet, TAKE 1 TABLET BY MOUTH TWICE A DAY (Patient not taking: Reported on 08/06/2022), Disp: 180 tablet, Rfl: 1   FLUoxetine (PROZAC) 20 MG capsule, TAKE 1 CAPSULE (20 MG TOTAL) BY MOUTH DAILY FOR 28 DAYS, THEN 2 CAPSULES (40 MG TOTAL) DAILY., Disp: 180 capsule, Rfl: 2   FLUoxetine (PROZAC) 40 MG capsule, Take 1 capsule (40 mg total) by mouth daily., Disp: 90 capsule, Rfl: 0   fluticasone (FLONASE) 50 MCG/ACT nasal spray, Place 2 sprays into both nostrils daily. (Patient not taking: Reported on 08/06/2022), Disp: 16 g, Rfl: 0   fluticasone (FLONASE) 50 MCG/ACT nasal spray, Place 2 sprays into both nostrils daily., Disp: 16 g, Rfl: 6   Glucose Blood (BLOOD GLUCOSE TEST STRIPS) STRP, 1 each by In Vitro route in the morning, at noon, and at bedtime. May substitute to any manufacturer covered by patient's insurance., Disp: 100 strip, Rfl: 0   Lancet Device MISC, 1 each by Does not apply route in the morning, at noon, and at bedtime. May substitute to any manufacturer covered by patient's insurance., Disp: 1 each, Rfl: 0   Lancets Misc. MISC, 1 each by Does not apply route in the morning, at noon, and at bedtime. May substitute to any manufacturer covered by patient's insurance., Disp: 100 each, Rfl: 0   metFORMIN (GLUCOPHAGE) 500 MG tablet, Take 1 tablet (500 mg total) by mouth daily with breakfast., Disp: 30 tablet, Rfl: 1   predniSONE (DELTASONE) 20 MG tablet, Take 2 tablets (40 mg total) by mouth daily with breakfast., Disp: 10 tablet, Rfl: 0   Vitamin D, Ergocalciferol, (DRISDOL) 1.25 MG (50000 UNIT) CAPS capsule, Take 1 capsule (50,000 Units total) by mouth every 7 (seven) days. (Patient not taking: Reported on 08/06/2022), Disp: 12 capsule, Rfl:  0  Observations/Objective: Patient is well-developed, well-nourished in no acute distress.  Resting comfortably at home.  Head is normocephalic, atraumatic.  No labored breathing.  Speech is clear and coherent with logical content.  Patient is alert and oriented at baseline.    Assessment and Plan: 1. Upper respiratory tract infection, unspecified type  - Advised we were unable to provide a work note for days we did not treat based off department policy. - Advised to contact Primary care office for considerations for them to provide a work note  Follow Up Instructions: I discussed the assessment and treatment plan with the patient. The patient was provided an opportunity to ask questions and all were answered. The patient agreed with the plan and demonstrated an understanding of the instructions.  A copy of instructions were sent to the patient via MyChart unless otherwise noted below.    The patient was advised to call back  or seek an in-person evaluation if the symptoms worsen or if the condition fails to improve as anticipated.  Time:  I spent 10 minutes with the patient via telehealth technology discussing the above problems/concerns.    [name], PA-C

## 2023-01-09 NOTE — Patient Instructions (Signed)
Dorothy Whitaker, thank you for joining [name], PA-C for today's virtual visit.  While this provider is not your primary care provider (PCP), if your PCP is located in our provider database this encounter information will be shared with them immediately following your visit.   [facility] account gives you access to today's visit and all your visits, tests, and labs performed at [facility] " click here if you don't have a [facility] account or go to [email or web address]  Consent: (Patient) Dorothy Whitaker provided verbal consent for this virtual visit at the beginning of the encounter.  Current Medications:  Current Outpatient Medications:    albuterol (VENTOLIN HFA) 108 (90 Base) MCG/ACT inhaler, Inhale 2 puffs into the lungs every 6 (six) hours as needed for wheezing or shortness of breath., Disp: 8 g, Rfl: 0   azelastine (ASTELIN) 0.1 % nasal spray, Place 1 spray into both nostrils 2 (two) times daily. Use in each nostril as directed, Disp: 30 mL, Rfl: 12   benzonatate (TESSALON) 100 MG capsule, Take 1 capsule (100 mg total) by mouth 3 (three) times daily as needed., Disp: 30 capsule, Rfl: 0   Blood Glucose Monitoring Suppl DEVI, 1 each by Does not apply route in the morning, at noon, and at bedtime. May substitute to any manufacturer covered by patient's insurance., Disp: 1 each, Rfl: 0   brompheniramine-pseudoephedrine-DM 30-2-10 MG/5ML syrup, Take 5 mLs by mouth 4 (four) times daily as needed., Disp: 120 mL, Rfl: 0   clonazePAM (KLONOPIN) 0.5 MG tablet, Take 1 tablet (0.5 mg total) by mouth 2 (two) times daily as needed for anxiety., Disp: 20 tablet, Rfl: 0   doxycycline (VIBRA-TABS) 100 MG tablet, Take 1 tablet (100 mg total) by mouth 2 (two) times daily., Disp: 14 tablet, Rfl: 0   ferrous sulfate 300 (60 Fe) MG/5ML syrup, Take 5 mLs (300 mg total) by mouth daily., Disp: 450 mL, Rfl: 3   ferrous sulfate 325 (65 FE) MG EC tablet, TAKE 1  TABLET BY MOUTH TWICE A DAY (Patient not taking: Reported on 08/06/2022), Disp: 180 tablet, Rfl: 1   FLUoxetine (PROZAC) 20 MG capsule, TAKE 1 CAPSULE (20 MG TOTAL) BY MOUTH DAILY FOR 28 DAYS, THEN 2 CAPSULES (40 MG TOTAL) DAILY., Disp: 180 capsule, Rfl: 2   FLUoxetine (PROZAC) 40 MG capsule, Take 1 capsule (40 mg total) by mouth daily., Disp: 90 capsule, Rfl: 0   fluticasone (FLONASE) 50 MCG/ACT nasal spray, Place 2 sprays into both nostrils daily. (Patient not taking: Reported on 08/06/2022), Disp: 16 g, Rfl: 0   fluticasone (FLONASE) 50 MCG/ACT nasal spray, Place 2 sprays into both nostrils daily., Disp: 16 g, Rfl: 6   Glucose Blood (BLOOD GLUCOSE TEST STRIPS) STRP, 1 each by In Vitro route in the morning, at noon, and at bedtime. May substitute to any manufacturer covered by patient's insurance., Disp: 100 strip, Rfl: 0   Lancet Device MISC, 1 each by Does not apply route in the morning, at noon, and at bedtime. May substitute to any manufacturer covered by patient's insurance., Disp: 1 each, Rfl: 0   Lancets Misc. MISC, 1 each by Does not apply route in the morning, at noon, and at bedtime. May substitute to any manufacturer covered by patient's insurance., Disp: 100 each, Rfl: 0   metFORMIN (GLUCOPHAGE) 500 MG tablet, Take 1 tablet (500 mg total) by mouth daily with breakfast., Disp: 30 tablet, Rfl: 1   predniSONE (DELTASONE) 20 MG tablet, Take 2  tablets (40 mg total) by mouth daily with breakfast., Disp: 10 tablet, Rfl: 0   Vitamin D, Ergocalciferol, (DRISDOL) 1.25 MG (50000 UNIT) CAPS capsule, Take 1 capsule (50,000 Units total) by mouth every 7 (seven) days. (Patient not taking: Reported on 08/06/2022), Disp: 12 capsule, Rfl: 0   Medications ordered in this encounter:  No orders of the defined types were placed in this encounter.    *If you need refills on other medications prior to your next appointment, please contact your pharmacy*  Follow-Up: Call back or seek an in-person evaluation if  the symptoms worsen or if the condition fails to improve as anticipated.  [facility] [phone]  Other Instructions  Call Primary Care Office for work note   If you have been instructed to have an in-person evaluation today at a local Urgent Care facility, please use the link below. It will take you to a list of all of our available [facility], including address, phone number and hours of operation. Please do not delay care.  [facility]  If you or a family member do not have a primary care provider, use the link below to schedule a visit and establish care. When you choose a [facility] primary care physician or advanced practice provider, you gain a long-term partner in health. Find a Primary Care Provider  Learn more about [facility] in-office and virtual care options: [facility]

## 2023-01-16 ENCOUNTER — Encounter: Payer: Self-pay | Admitting: Physician Assistant

## 2023-01-16 ENCOUNTER — Telehealth: Payer: Self-pay | Admitting: Physician Assistant

## 2023-01-16 DIAGNOSIS — F32A Depression, unspecified: Secondary | ICD-10-CM

## 2023-01-16 DIAGNOSIS — F419 Anxiety disorder, unspecified: Secondary | ICD-10-CM

## 2023-01-16 MED ORDER — FLUOXETINE HCL 20 MG PO CAPS: 20.0000 mg | ORAL_CAPSULE | Freq: Every day | ORAL | 0 refills | Status: AC

## 2023-01-16 MED ORDER — HYDROXYZINE HCL 10 MG PO TABS: 10.0000 mg | ORAL_TABLET | Freq: Three times a day (TID) | ORAL | 0 refills | Status: AC | PRN

## 2023-01-16 NOTE — Patient Instructions (Addendum)
Dorothy Whitaker, thank you for joining Leeanne Rio, PA-C for today's virtual visit.  While this provider is not your primary care provider (PCP), if your PCP is located in our provider database this encounter information will be shared with them immediately following your visit.   [place] account gives you access to today's visit and all your visits, tests, and labs performed at [facility] " click here if you don't have a [place] account or go to [email or web address]  Consent: (Patient) Dorothy Whitaker provided verbal consent for this virtual visit at the beginning of the encounter.  Current Medications:  Current Outpatient Medications:    albuterol (VENTOLIN HFA) 108 (90 Base) MCG/ACT inhaler, Inhale 2 puffs into the lungs every 6 (six) hours as needed for wheezing or shortness of breath., Disp: 8 g, Rfl: 0   azelastine (ASTELIN) 0.1 % nasal spray, Place 1 spray into both nostrils 2 (two) times daily. Use in each nostril as directed, Disp: 30 mL, Rfl: 12   benzonatate (TESSALON) 100 MG capsule, Take 1 capsule (100 mg total) by mouth 3 (three) times daily as needed., Disp: 30 capsule, Rfl: 0   Blood Glucose Monitoring Suppl DEVI, 1 each by Does not apply route in the morning, at noon, and at bedtime. May substitute to any manufacturer covered by patient's insurance., Disp: 1 each, Rfl: 0   brompheniramine-pseudoephedrine-DM 30-2-10 MG/5ML syrup, Take 5 mLs by mouth 4 (four) times daily as needed., Disp: 120 mL, Rfl: 0   clonazePAM (KLONOPIN) 0.5 MG tablet, Take 1 tablet (0.5 mg total) by mouth 2 (two) times daily as needed for anxiety., Disp: 20 tablet, Rfl: 0   doxycycline (VIBRA-TABS) 100 MG tablet, Take 1 tablet (100 mg total) by mouth 2 (two) times daily., Disp: 14 tablet, Rfl: 0   ferrous sulfate 300 (60 Fe) MG/5ML syrup, Take 5 mLs (300 mg total) by mouth daily., Disp: 450 mL, Rfl: 3   ferrous sulfate 325 (65 FE) MG EC tablet, TAKE 1  TABLET BY MOUTH TWICE A DAY (Patient not taking: Reported on 08/06/2022), Disp: 180 tablet, Rfl: 1   FLUoxetine (PROZAC) 20 MG capsule, TAKE 1 CAPSULE (20 MG TOTAL) BY MOUTH DAILY FOR 28 DAYS, THEN 2 CAPSULES (40 MG TOTAL) DAILY., Disp: 180 capsule, Rfl: 2   FLUoxetine (PROZAC) 40 MG capsule, Take 1 capsule (40 mg total) by mouth daily., Disp: 90 capsule, Rfl: 0   fluticasone (FLONASE) 50 MCG/ACT nasal spray, Place 2 sprays into both nostrils daily. (Patient not taking: Reported on 08/06/2022), Disp: 16 g, Rfl: 0   fluticasone (FLONASE) 50 MCG/ACT nasal spray, Place 2 sprays into both nostrils daily., Disp: 16 g, Rfl: 6   Glucose Blood (BLOOD GLUCOSE TEST STRIPS) STRP, 1 each by In Vitro route in the morning, at noon, and at bedtime. May substitute to any manufacturer covered by patient's insurance., Disp: 100 strip, Rfl: 0   Lancet Device MISC, 1 each by Does not apply route in the morning, at noon, and at bedtime. May substitute to any manufacturer covered by patient's insurance., Disp: 1 each, Rfl: 0   Lancets Misc. MISC, 1 each by Does not apply route in the morning, at noon, and at bedtime. May substitute to any manufacturer covered by patient's insurance., Disp: 100 each, Rfl: 0   metFORMIN (GLUCOPHAGE) 500 MG tablet, Take 1 tablet (500 mg total) by mouth daily with breakfast., Disp: 30 tablet, Rfl: 1   predniSONE (DELTASONE) 20 MG tablet, Take 2  tablets (40 mg total) by mouth daily with breakfast., Disp: 10 tablet, Rfl: 0   Vitamin D, Ergocalciferol, (DRISDOL) 1.25 MG (50000 UNIT) CAPS capsule, Take 1 capsule (50,000 Units total) by mouth every 7 (seven) days. (Patient not taking: Reported on 08/06/2022), Disp: 12 capsule, Rfl: 0   Medications ordered in this encounter:  No orders of the defined types were placed in this encounter.    *If you need refills on other medications prior to your next appointment, please contact your pharmacy*  Follow-Up: Call back or seek an in-person evaluation if  the symptoms worsen or if the condition fails to improve as anticipated.  [facility] [phone]  Other Instructions Please restart your fluoxetine taking 1 capsule daily. The hydroxyzine is to be used as directed when needed for acute anxiety or issue with sleep. Please download the calm as discussed you can start some mindfulness practices to help with anxiety. Please see the counseling resources below. I want you to call your previous primary care office so that they can set you up for follow-up appointment with another [facility] provider in the next couple of weeks. They can also get you set up with your new true PCP.      If you have been instructed to have an in-person evaluation today at a local Urgent Care facility, please use the link below. It will take you to a list of all of our available [facility], including address, phone number and hours of operation. Please do not delay care.  [facility]  If you or a family member do not have a primary care provider, use the link below to schedule a visit and establish care. When you choose a [facility] primary care physician or advanced practice provider, you gain a long-term partner in health. Find a Primary Care Provider  Learn more about [facility]'s in-office and virtual care options: [facility]

## 2023-01-16 NOTE — Progress Notes (Signed)
Virtual Visit Consent   Dorothy Whitaker, you are scheduled for a virtual visit with a [facility] provider today. Just as with appointments in the office, your consent must be obtained to participate. Your consent will be active for this visit and any virtual visit you may have with one of our providers in the next 365 days. If you have a MyChart account, a copy of this consent can be sent to you electronically.  As this is a virtual visit, video technology does not allow for your provider to perform a traditional examination. This may limit your provider's ability to fully assess your condition. If your provider identifies any concerns that need to be evaluated in person or the need to arrange testing (such as labs, EKG, etc.), we will make arrangements to do so. Although advances in technology are sophisticated, we cannot ensure that it will always work on either your end or our end. If the connection with a video visit is poor, the visit may have to be switched to a telephone visit. With either a video or telephone visit, we are not always able to ensure that we have a secure connection.  By engaging in this virtual visit, you consent to the provision of healthcare and authorize for your insurance to be billed (if applicable) for the services provided during this visit. Depending on your insurance coverage, you may receive a charge related to this service.  I need to obtain your verbal consent now. Are you willing to proceed with your visit today? Dorothy Whitaker has provided verbal consent on 01/16/2023 for a virtual visit (video or telephone). Dorothy Whitaker, [place]  Date: 01/16/2023 2:02 PM  Virtual Visit via Video Note   I, Dorothy Whitaker, connected with  Dorothy Whitaker  ([id number], 16-Sep-1996) on 01/16/23 at  1:30 PM EDT by a video-enabled telemedicine application and verified that I am speaking with the correct person using two identifiers.  Location: Patient: Virtual Visit  Location Patient: Home Provider: Virtual Visit Location Provider: Home Office   I discussed the limitations of evaluation and management by telemedicine and the availability of in person appointments. The patient expressed understanding and agreed to proceed.    History of Present Illness: Dorothy Whitaker is a 27 y.o. who identifies as a female who was assigned female at birth, and is being seen today for some increased anxiety levels recently along with depressed mood and anhedonia.  Had been doing well up until recently -- some substantial change in stressors causing current symptoms. Has not been to work due to anxiety.  Notes some difficulty with sleep.  Denies suicidal ideation.  She notes this is all been going on for the past 2 to 3 weeks.  Was hoping it would pass but has not.  Of note she was prescribed fluoxetine 40 mg which she has not taken in the past several months.  She felt like she did not need the medication.  Has also previously been prescribed clonazepam to use as needed but does not have this medication either.  Her PCP left the practice and she is awaiting an appointment with a new provider at the office.  HPI: HPI  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   Panic attack 06/26/2021   Symptomatic anemia 11/15/2019   Heavy menstrual bleeding 11/15/2019   Asthma    Acanthosis nigricans 09/21/2015   Body  mass index (BMI) of 36.0-36.9 in adult 07/17/2015    Allergies: No Known Allergies Medications:  Current Outpatient Medications:    albuterol (VENTOLIN HFA) 108 (90 Base) MCG/ACT inhaler, Inhale 2 puffs into the lungs every 6 (six) hours as needed for wheezing or shortness of breath., Disp: 8 g, Rfl: 0   Blood Glucose Monitoring Suppl DEVI, 1 each by Does not apply route in the morning, at noon, and at bedtime. May substitute to any manufacturer covered by patient's  insurance., Disp: 1 each, Rfl: 0   clonazePAM (KLONOPIN) 0.5 MG tablet, Take 1 tablet (0.5 mg total) by mouth 2 (two) times daily as needed for anxiety., Disp: 20 tablet, Rfl: 0   ferrous sulfate 300 (60 Fe) MG/5ML syrup, Take 5 mLs (300 mg total) by mouth daily., Disp: 450 mL, Rfl: 3   ferrous sulfate 325 (65 FE) MG EC tablet, TAKE 1 TABLET BY MOUTH TWICE A DAY (Patient not taking: Reported on 08/06/2022), Disp: 180 tablet, Rfl: 1   FLUoxetine (PROZAC) 20 MG capsule, TAKE 1 CAPSULE (20 MG TOTAL) BY MOUTH DAILY FOR 28 DAYS, THEN 2 CAPSULES (40 MG TOTAL) DAILY., Disp: 180 capsule, Rfl: 2   FLUoxetine (PROZAC) 40 MG capsule, Take 1 capsule (40 mg total) by mouth daily., Disp: 90 capsule, Rfl: 0   Glucose Blood (BLOOD GLUCOSE TEST STRIPS) STRP, 1 each by In Vitro route in the morning, at noon, and at bedtime. May substitute to any manufacturer covered by patient's insurance., Disp: 100 strip, Rfl: 0   Lancet Device MISC, 1 each by Does not apply route in the morning, at noon, and at bedtime. May substitute to any manufacturer covered by patient's insurance., Disp: 1 each, Rfl: 0   Lancets Misc. MISC, 1 each by Does not apply route in the morning, at noon, and at bedtime. May substitute to any manufacturer covered by patient's insurance., Disp: 100 each, Rfl: 0   metFORMIN (GLUCOPHAGE) 500 MG tablet, Take 1 tablet (500 mg total) by mouth daily with breakfast., Disp: 30 tablet, Rfl: 1   Vitamin D, Ergocalciferol, (DRISDOL) 1.25 MG (50000 UNIT) CAPS capsule, Take 1 capsule (50,000 Units total) by mouth every 7 (seven) days. (Patient not taking: Reported on 08/06/2022), Disp: 12 capsule, Rfl: 0  Observations/Objective: Patient is well-developed, well-nourished in no acute distress.  Resting comfortably  at home.  Head is normocephalic, atraumatic.  No labored breathing.  Speech is clear and coherent with logical content.  Patient is alert and oriented at baseline.   Assessment and Plan: 1. Anxiety and  depression  Given she has taken this medication previously and tolerated well, will restart her on fluoxetine 20 mg daily.  She needs to schedule a follow-up appointment with one of the covering providers at her primary care office within the next month, preferably within the next week.  Will also give trial of hydroxyzine 10 mg to use as needed up to 3 times daily for anxiety, as well as to help with sleep.  Discussed mindfulness meditation and resources given.  Counseling resources provided to the patient.  Strict ER precautions reviewed with patient.  Follow Up Instructions: I discussed the assessment and treatment plan with the patient. The patient was provided an opportunity to ask questions and all were answered. The patient agreed with the plan and demonstrated an understanding of the instructions.  A copy of instructions were sent to the patient via MyChart unless otherwise noted below.   The patient was advised to call back or seek  an in-person evaluation if the symptoms worsen or if the condition fails to improve as anticipated.  Time:  I spent 20 minutes with the patient via telehealth technology discussing the above problems/concerns.    Dorothy Rio, PA-C

## 2023-01-19 ENCOUNTER — Telehealth: Payer: Self-pay | Admitting: Physician Assistant

## 2023-01-19 DIAGNOSIS — F419 Anxiety disorder, unspecified: Secondary | ICD-10-CM

## 2023-01-19 NOTE — Patient Instructions (Signed)
Dorothy Whitaker, thank you for joining Dorothy Arena Ward, PA-C for today's virtual visit.  While this provider is not your primary care provider (PCP), if your PCP is located in our provider database this encounter information will be shared with them immediately following your visit.   [facility] account gives you access to today's visit and all your visits, tests, and labs performed at [facility] " click here if you don't have a [facility] account or go to [email or web address]  Consent: (Patient) Dorothy Whitaker provided verbal consent for this virtual visit at the beginning of the encounter.  Current Medications:  Current Outpatient Medications:    albuterol (VENTOLIN HFA) 108 (90 Base) MCG/ACT inhaler, Inhale 2 puffs into the lungs every 6 (six) hours as needed for wheezing or shortness of breath., Disp: 8 g, Rfl: 0   Blood Glucose Monitoring Suppl DEVI, 1 each by Does not apply route in the morning, at noon, and at bedtime. May substitute to any manufacturer covered by patient's insurance., Disp: 1 each, Rfl: 0   clonazePAM (KLONOPIN) 0.5 MG tablet, Take 1 tablet (0.5 mg total) by mouth 2 (two) times daily as needed for anxiety., Disp: 20 tablet, Rfl: 0   ferrous sulfate 300 (60 Fe) MG/5ML syrup, Take 5 mLs (300 mg total) by mouth daily., Disp: 450 mL, Rfl: 3   ferrous sulfate 325 (65 FE) MG EC tablet, TAKE 1 TABLET BY MOUTH TWICE A DAY (Patient not taking: Reported on 08/06/2022), Disp: 180 tablet, Rfl: 1   FLUoxetine (PROZAC) 20 MG capsule, Take 1 capsule (20 mg total) by mouth daily., Disp: 30 capsule, Rfl: 0   Glucose Blood (BLOOD GLUCOSE TEST STRIPS) STRP, 1 each by In Vitro route in the morning, at noon, and at bedtime. May substitute to any manufacturer covered by patient's insurance., Disp: 100 strip, Rfl: 0   hydrOXYzine (ATARAX) 10 MG tablet, Take 1 tablet (10 mg total) by mouth 3 (three) times daily as needed for anxiety., Disp: 30 tablet, Rfl:  0   Lancet Device MISC, 1 each by Does not apply route in the morning, at noon, and at bedtime. May substitute to any manufacturer covered by patient's insurance., Disp: 1 each, Rfl: 0   Lancets Misc. MISC, 1 each by Does not apply route in the morning, at noon, and at bedtime. May substitute to any manufacturer covered by patient's insurance., Disp: 100 each, Rfl: 0   metFORMIN (GLUCOPHAGE) 500 MG tablet, Take 1 tablet (500 mg total) by mouth daily with breakfast., Disp: 30 tablet, Rfl: 1   Vitamin D, Ergocalciferol, (DRISDOL) 1.25 MG (50000 UNIT) CAPS capsule, Take 1 capsule (50,000 Units total) by mouth every 7 (seven) days. (Patient not taking: Reported on 08/06/2022), Disp: 12 capsule, Rfl: 0   Medications ordered in this encounter:  No orders of the defined types were placed in this encounter.    *If you need refills on other medications prior to your next appointment, please contact your pharmacy*  Follow-Up: Call back or seek an in-person evaluation if the symptoms worsen or if the condition fails to improve as anticipated.  [facility] [phone]  Other Instructions  Continue with Fluoxetine, it may be a few weeks before you notice improvement.  Can take the hydroxyzine as needed.  Call to set up with a new PCP.     If you have been instructed to have an in-person evaluation today at a local Urgent Care facility, please use the  link below. It will take you to a list of all of our available [facility]gent [facility], including address, phone number and hours of operation. Please do not delay care.  [facility]  If you or a family member do not have a primary care provider, use the link below to schedule a visit and establish care. When you choose a [facility] primary care physician or advanced practice provider, you gain a long-term partner in health. Find a Primary Care Provider  Learn more about [facility]'s in-office and virtual care options: [facility] Now

## 2023-01-19 NOTE — Progress Notes (Signed)
Virtual Visit Consent   [name], you are scheduled for a virtual visit with a [facility] provider today. Just as with appointments in the office, your consent must be obtained to participate. Your consent will be active for this visit and any virtual visit you may have with one of our providers in the next 365 days. If you have a MyChart account, a copy of this consent can be sent to you electronically.  As this is a virtual visit, video technology does not allow for your provider to perform a traditional examination. This may limit your provider's ability to fully assess your condition. If your provider identifies any concerns that need to be evaluated in person or the need to arrange testing (such as labs, EKG, etc.), we will make arrangements to do so. Although advances in technology are sophisticated, we cannot ensure that it will always work on either your end or our end. If the connection with a video visit is poor, the visit may have to be switched to a telephone visit. With either a video or telephone visit, we are not always able to ensure that we have a secure connection.  By engaging in this virtual visit, you consent to the provision of healthcare and authorize for your insurance to be billed (if applicable) for the services provided during this visit. Depending on your insurance coverage, you may receive a charge related to this service.  I need to obtain your verbal consent now. Are you willing to proceed with your visit today? Dorothy KOCHERSPERGER has provided verbal consent on 01/19/2023 for a virtual visit (video or telephone). Lenise Arena Ward, PA-C  Date: 01/19/2023 4:36 PM  Virtual Visit via Video Note   I, Lenise Arena Ward, connected with  Dorothy Whitaker  ([id number], 15-Aug-1996) on 01/19/23 at  4:30 PM EDT by a video-enabled telemedicine application and verified that I am speaking with the correct person using two identifiers.  Location: Patient: Virtual Visit Location  Patient: Home Provider: Virtual Visit Location Provider: Home Office   I discussed the limitations of evaluation and management by telemedicine and the availability of in person appointments. The patient expressed understanding and agreed to proceed.    History of Present Illness: Dorothy Whitaker is a 27 y.o. who identifies as a female who was assigned female at birth, and is being seen today for sx of anxiety.  She was seen three days ago and restarted fluoxetine.  She was also prescribed hydroxyzine which she has not tried yet.  She is went to work today and felt overwhelmed, anxious, feels pressure from boss.  She has not yet contacted PCP.  She is requesting an additional work note.  Denies SI.    HPI: HPI  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   Panic attack 06/26/2021   Symptomatic anemia 11/15/2019   Heavy menstrual bleeding 11/15/2019   Asthma    Acanthosis nigricans 09/21/2015   Body mass index (BMI) of 36.0-36.9 in adult 07/17/2015    Allergies: No Known Allergies Medications:  Current Outpatient Medications:    albuterol (VENTOLIN HFA) 108 (90 Base) MCG/ACT inhaler, Inhale 2 puffs into the lungs every 6 (six) hours as needed for wheezing or shortness of breath., Disp: 8 g, Rfl: 0   Blood Glucose Monitoring Suppl DEVI, 1 each by Does not apply route in the morning, at noon, and at bedtime. May  substitute to any manufacturer covered by patient's insurance., Disp: 1 each, Rfl: 0   clonazePAM (KLONOPIN) 0.5 MG tablet, Take 1 tablet (0.5 mg total) by mouth 2 (two) times daily as needed for anxiety., Disp: 20 tablet, Rfl: 0   ferrous sulfate 300 (60 Fe) MG/5ML syrup, Take 5 mLs (300 mg total) by mouth daily., Disp: 450 mL, Rfl: 3   ferrous sulfate 325 (65 FE) MG EC tablet, TAKE 1 TABLET BY MOUTH TWICE A DAY (Patient not taking: Reported on 08/06/2022), Disp: 180 tablet, Rfl: 1    FLUoxetine (PROZAC) 20 MG capsule, Take 1 capsule (20 mg total) by mouth daily., Disp: 30 capsule, Rfl: 0   Glucose Blood (BLOOD GLUCOSE TEST STRIPS) STRP, 1 each by In Vitro route in the morning, at noon, and at bedtime. May substitute to any manufacturer covered by patient's insurance., Disp: 100 strip, Rfl: 0   hydrOXYzine (ATARAX) 10 MG tablet, Take 1 tablet (10 mg total) by mouth 3 (three) times daily as needed for anxiety., Disp: 30 tablet, Rfl: 0   Lancet Device MISC, 1 each by Does not apply route in the morning, at noon, and at bedtime. May substitute to any manufacturer covered by patient's insurance., Disp: 1 each, Rfl: 0   Lancets Misc. MISC, 1 each by Does not apply route in the morning, at noon, and at bedtime. May substitute to any manufacturer covered by patient's insurance., Disp: 100 each, Rfl: 0   metFORMIN (GLUCOPHAGE) 500 MG tablet, Take 1 tablet (500 mg total) by mouth daily with breakfast., Disp: 30 tablet, Rfl: 1   Vitamin D, Ergocalciferol, (DRISDOL) 1.25 MG (50000 UNIT) CAPS capsule, Take 1 capsule (50,000 Units total) by mouth every 7 (seven) days. (Patient not taking: Reported on 08/06/2022), Disp: 12 capsule, Rfl: 0  Observations/Objective: Patient is well-developed, well-nourished in no acute distress.  Resting comfortably at home.  Head is normocephalic, atraumatic.  No labored breathing.  Speech is clear and coherent with logical content.  Patient is alert and oriented at baseline.    Assessment and Plan: 1. Anxiety  Advised to continue with fluoxetine and take hydroxyzine as needed.  Discussed importance of following up with PCP.  Discussed behavioral help Urgent Care.   Follow Up Instructions: I discussed the assessment and treatment plan with the patient. The patient was provided an opportunity to ask questions and all were answered. The patient agreed with the plan and demonstrated an understanding of the instructions.  A copy of instructions were sent to the  patient via MyChart unless otherwise noted below.     The patient was advised to call back or seek an in-person evaluation if the symptoms worsen or if the condition fails to improve as anticipated.  Time:  I spent 10 minutes with the patient via telehealth technology discussing the above problems/concerns.    Lenise Arena Ward, PA-C

## 2023-01-21 ENCOUNTER — Ambulatory Visit: Payer: Self-pay | Admitting: Family Medicine

## 2023-01-21 ENCOUNTER — Telehealth: Payer: Self-pay

## 2023-01-21 NOTE — Telephone Encounter (Signed)
Per no show policy, this is grounds to dismiss her from practice. Please let me know if you would like to proceed

## 2023-01-21 NOTE — Telephone Encounter (Signed)
Patient has a chronic history of no showing or cancelling her appts. Patient use to see Richard in that past. Patient has no showed 10 out of 30 of her appts. Patient is aware that she had appt with Dr.Greene today. I spoke with patient yesterday about her insurance. Patient stated that she would call me back when she got home. I never received a phone call from her yesterday. What are the next step for this patient at [facility]?

## 2023-01-21 NOTE — Telephone Encounter (Signed)
Please advise if pt should be dismissed from practice

## 2023-01-23 NOTE — Telephone Encounter (Signed)
Please advise 

## 2023-01-27 NOTE — Telephone Encounter (Signed)
I have only seen this patient 1 time.  If the no-show policy indicates that she needs to be dismissed based on her recurrent no-shows, then we will need to proceed with that policy.  Thanks.

## 2023-01-29 ENCOUNTER — Encounter: Payer: Self-pay | Admitting: Family Medicine

## 2023-01-29 NOTE — Telephone Encounter (Signed)
I have dismissed patient and sent her letter via my chart and mail

## 2023-02-17 ENCOUNTER — Telehealth: Payer: Self-pay | Admitting: Physician Assistant

## 2023-02-17 ENCOUNTER — Telehealth: Payer: Self-pay | Admitting: Nurse Practitioner

## 2023-02-17 DIAGNOSIS — B379 Candidiasis, unspecified: Secondary | ICD-10-CM

## 2023-02-17 DIAGNOSIS — B3731 Acute candidiasis of vulva and vagina: Secondary | ICD-10-CM

## 2023-02-17 MED ORDER — FLUCONAZOLE 150 MG PO TABS: 150.0000 mg | ORAL_TABLET | ORAL | 0 refills | Status: DC | PRN

## 2023-02-17 NOTE — Progress Notes (Signed)
Also scheduled an Evisit was already treated.

## 2023-02-17 NOTE — Progress Notes (Signed)

## 2023-03-04 ENCOUNTER — Emergency Department (HOSPITAL_BASED_OUTPATIENT_CLINIC_OR_DEPARTMENT_OTHER)
Admission: EM | Admit: 2023-03-04 | Discharge: 2023-03-04 | Disposition: A | Discharge status: Home / Self Care | Payer: Self-pay | Attending: Emergency Medicine | Admitting: Emergency Medicine

## 2023-03-04 ENCOUNTER — Encounter (HOSPITAL_BASED_OUTPATIENT_CLINIC_OR_DEPARTMENT_OTHER): Payer: Self-pay | Admitting: Emergency Medicine

## 2023-03-04 ENCOUNTER — Other Ambulatory Visit: Payer: Self-pay

## 2023-03-04 DIAGNOSIS — N939 Abnormal uterine and vaginal bleeding, unspecified: Secondary | ICD-10-CM | POA: Insufficient documentation

## 2023-03-04 LAB — HCG, SERUM, QUALITATIVE: Preg, Serum: NEGATIVE

## 2023-03-04 NOTE — Discharge Instructions (Signed)
Your pregnancy test was negative, your bleeding is likely the start of your menstrual cycle

## 2023-03-04 NOTE — ED Triage Notes (Signed)
Pt here requesting blood pregnancy test. States has spotting, cramps and using an app to track periods. States had unprotected sex.

## 2023-03-04 NOTE — ED Provider Notes (Signed)
[facility] Provider Note   CSN: [id number] Arrival date & time: 03/04/23  4098     History  Chief Complaint  Patient presents with   Possible Pregnancy    LAPRECIOUS AUSTILL is a 27 y.o. female.  The history is provided by the patient.  Vaginal Bleeding Quality:  Spotting Severity:  Mild Onset quality:  Gradual Duration:  1 day Timing:  Intermittent Progression:  Unchanged Chronicity:  New Possible pregnancy: had unprotected sex and is concerned for pregnancy.   Relieved by:  Nothing Worsened by:  Nothing Ineffective treatments:  None tried Associated symptoms: no abdominal pain and no fever   Risk factors: no bleeding disorder   Patient with LMP March 28, presents with spotting x 1 day and concern for pregnancy      Home Medications Prior to Admission medications   Medication Sig Start Date End Date Taking? Authorizing Provider  albuterol (VENTOLIN HFA) 108 (90 Base) MCG/ACT inhaler Inhale 2 puffs into the lungs every 6 (six) hours as needed for wheezing or shortness of breath. 12/02/22   Viviano Simas, FNP  Blood Glucose Monitoring Suppl DEVI 1 each by Does not apply route in the morning, at noon, and at bedtime. May substitute to any manufacturer covered by patient's insurance. 12/21/22   Achille Rich, PA-C  clonazePAM (KLONOPIN) 0.5 MG tablet Take 1 tablet (0.5 mg total) by mouth 2 (two) times daily as needed for anxiety. 08/06/22   Shade Flood, MD  ferrous sulfate 300 (60 Fe) MG/5ML syrup Take 5 mLs (300 mg total) by mouth daily. 08/08/22   Shade Flood, MD  ferrous sulfate 325 (65 FE) MG EC tablet TAKE 1 TABLET BY MOUTH TWICE A DAY Patient not taking: Reported on 08/06/2022 09/30/21   [name], NP  fluconazole (DIFLUCAN) 150 MG tablet Take 1 tablet (150 mg total) by mouth every 3 (three) days as needed. 02/17/23   Margaretann Loveless, PA-C  FLUoxetine (PROZAC) 20 MG capsule Take 1 capsule (20 mg total) by  mouth daily. 01/16/23   Waldon Merl, PA-C  hydrOXYzine (ATARAX) 10 MG tablet Take 1 tablet (10 mg total) by mouth 3 (three) times daily as needed for anxiety. 01/16/23   Waldon Merl, PA-C  metFORMIN (GLUCOPHAGE) 500 MG tablet Take 1 tablet (500 mg total) by mouth daily with breakfast. 12/21/22   Achille Rich, PA-C  Vitamin D, Ergocalciferol, (DRISDOL) 1.25 MG (50000 UNIT) CAPS capsule Take 1 capsule (50,000 Units total) by mouth every 7 (seven) days. Patient not taking: Reported on 08/06/2022 07/04/21   [name], NP      Allergies    Patient has no known allergies.    Review of Systems   Review of Systems  Constitutional:  Negative for fever.  Gastrointestinal:  Negative for abdominal pain and vomiting.  Genitourinary:  Positive for vaginal bleeding.  All other systems reviewed and are negative.   Physical Exam Updated Vital Signs BP (!) 148/106 (BP Location: Right Arm)   Pulse 92   Temp 97.8 F (36.6 C) (Oral)   Resp 16   Ht 5\' 3"  (1.6 m)   Wt 85.7 kg   LMP 01/31/2023 (Exact Date)   SpO2 96%   BMI 33.48 kg/m  Physical Exam Vitals and nursing note reviewed.  Constitutional:      General: She is not in acute distress.    Appearance: Normal appearance. She is well-developed.  HENT:     Head: Normocephalic  and atraumatic.     Nose: Nose normal.  Eyes:     Pupils: Pupils are equal, round, and reactive to light.  Cardiovascular:     Rate and Rhythm: Normal rate and regular rhythm.     Pulses: Normal pulses.     Heart sounds: Normal heart sounds.  Pulmonary:     Effort: Pulmonary effort is normal. No respiratory distress.     Breath sounds: Normal breath sounds.  Abdominal:     General: Bowel sounds are normal. There is no distension.     Palpations: Abdomen is soft.     Tenderness: There is no abdominal tenderness. There is no guarding or rebound.  Genitourinary:    Vagina: No vaginal discharge.  Musculoskeletal:        General: Normal range of motion.      Cervical back: Neck supple.  Skin:    General: Skin is warm and dry.     Capillary Refill: Capillary refill takes less than 2 seconds.     Findings: No erythema or rash.  Neurological:     General: No focal deficit present.     Mental Status: She is alert and oriented to person, place, and time.     Deep Tendon Reflexes: Reflexes normal.  Psychiatric:        Mood and Affect: Mood normal.        Behavior: Behavior normal.     ED Results / Procedures / Treatments   Labs (all labs ordered are listed, but only abnormal results are displayed) Labs Reviewed  HCG, SERUM, QUALITATIVE    EKG None  Radiology No results found.  Procedures Procedures    Medications Ordered in ED Medications - No data to display  ED Course/ Medical Decision Making/ A&P                             Medical Decision Making Patient with vaginal spotting and recent unprotected encounter.    Amount and/or Complexity of Data Reviewed Independent Historian: friend    Details: See above  External Data Reviewed: notes.    Details: Previous notes reviewed  Labs: ordered.  Risk Risk Details: Well appearing, no pain.  Patient wants to know if she is pregnant.      Final Clinical Impression(s) / ED Diagnoses Final diagnoses:  None   Signed out to Dr. Karene Fry at change of shift pending pregnancy test  Rx / DC Orders ED Discharge Orders     None         Findlay Dagher, MD 03/04/23 1610

## 2023-03-05 ENCOUNTER — Telehealth: Payer: Self-pay | Admitting: Physician Assistant

## 2023-03-05 DIAGNOSIS — B3731 Acute candidiasis of vulva and vagina: Secondary | ICD-10-CM

## 2023-03-05 MED ORDER — FLUCONAZOLE 150 MG PO TABS: 150.0000 mg | ORAL_TABLET | Freq: Once | ORAL | 0 refills | Status: AC

## 2023-03-05 NOTE — Progress Notes (Signed)

## 2023-03-12 ENCOUNTER — Telehealth: Payer: Self-pay | Admitting: Physician Assistant

## 2023-03-12 DIAGNOSIS — B3731 Acute candidiasis of vulva and vagina: Secondary | ICD-10-CM

## 2023-03-12 DIAGNOSIS — N760 Acute vaginitis: Secondary | ICD-10-CM

## 2023-03-12 MED ORDER — FLUCONAZOLE 150 MG PO TABS: 150.0000 mg | ORAL_TABLET | Freq: Once | ORAL | 0 refills | Status: AC

## 2023-03-12 NOTE — Progress Notes (Signed)
Virtual Visit Consent   Dorothy Whitaker, you are scheduled for a virtual visit with a [facility] provider today. Just as with appointments in the office, your consent must be obtained to participate. Your consent will be active for this visit and any virtual visit you may have with one of our providers in the next 365 days. If you have a MyChart account, a copy of this consent can be sent to you electronically.  As this is a virtual visit, video technology does not allow for your provider to perform a traditional examination. This may limit your provider's ability to fully assess your condition. If your provider identifies any concerns that need to be evaluated in person or the need to arrange testing (such as labs, EKG, etc.), we will make arrangements to do so. Although advances in technology are sophisticated, we cannot ensure that it will always work on either your end or our end. If the connection with a video visit is poor, the visit may have to be switched to a telephone visit. With either a video or telephone visit, we are not always able to ensure that we have a secure connection.  By engaging in this virtual visit, you consent to the provision of healthcare and authorize for your insurance to be billed (if applicable) for the services provided during this visit. Depending on your insurance coverage, you may receive a charge related to this service.  I need to obtain your verbal consent now. Are you willing to proceed with your visit today? Dorothy Whitaker has provided verbal consent on 03/12/2023 for a virtual visit (video or telephone). Dorothy Whitaker, [place]  Date: 03/12/2023 1:48 PM  Virtual Visit via Video Note   I, Dorothy Whitaker, connected with  Dorothy Whitaker  ([id number], December 06, 1995) on 03/12/23 at  1:30 PM EDT by a video-enabled telemedicine application and verified that I am speaking with the correct person using two identifiers.  Location: Patient: Virtual Visit  Location Patient: Home Provider: Virtual Visit Location Provider: Home Office   I discussed the limitations of evaluation and management by telemedicine and the availability of in person appointments. The patient expressed understanding and agreed to proceed.    History of Present Illness: Dorothy Whitaker is a 27 y.o. who identifies as a female who was assigned female at birth, and is being seen today for continued symptoms of vaginal itching and discharge. Was evaluated on 5/2 and prescribed diflucan for suspected yeast vaginitis. Notes she had the tablet but when trying to get out and take, she dropped into the toilet and so was unable to take. Thought she could treat with OTC medication but without resolution. Denies concern for pregnancy or STI. Denies dysuria, urgency or frequency.  Denies fever, chills, pelvic pain.   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 May substitute to any manufacturer covered by patient's insurance., Disp: 1 each, Rfl: 0   clonazePAM (KLONOPIN) 0.5 MG tablet, Take 1 tablet (0.5 mg total) by mouth 2 (two) times daily as needed for anxiety., Disp: 20 tablet, Rfl: 0   ferrous sulfate 300 (60 Fe) MG/5ML syrup, Take 5 mLs (300 mg total) by mouth daily., Disp: 450 mL, Rfl: 3   ferrous sulfate 325 (65 FE)  MG EC tablet, TAKE 1 TABLET BY MOUTH TWICE A DAY (Patient not taking: Reported on 08/06/2022), Disp: 180 tablet, Rfl: 1   FLUoxetine (PROZAC) 20 MG capsule, Take 1 capsule (20 mg total) by mouth daily., Disp: 30 capsule, Rfl: 0   hydrOXYzine (ATARAX) 10 MG tablet, Take 1 tablet (10 mg total) by mouth 3 (three) times daily as needed for anxiety., Disp: 30 tablet, Rfl: 0   metFORMIN (GLUCOPHAGE) 500 MG tablet, Take 1 tablet (500 mg total) by mouth daily with breakfast., Disp: 30 tablet, Rfl: 1   Vitamin D, Ergocalciferol, (DRISDOL) 1.25 MG (50000 UNIT) CAPS capsule, Take 1 capsule (50,000 Units total) by mouth every 7 (seven) days. (Patient not taking: Reported on 08/06/2022), Disp: 12 capsule, Rfl: 0  Observations/Objective: Patient is well-developed, well-nourished in no acute distress.  Resting comfortably at home.  Head is normocephalic, atraumatic.  No labored breathing. Speech is clear and coherent with logical content.  Patient is alert and oriented at baseline.   Assessment and Plan: 1. Yeast vaginitis  Script for Diflucan refilled giving loss. Will require in-person eval if not resolving.  Follow Up Instructions: I discussed the assessment and treatment plan with the patient. The patient was provided an opportunity to ask questions and all were answered. The patient agreed with the plan and demonstrated an understanding of the instructions.  A copy of instructions were sent to the patient via MyChart unless otherwise noted below.   The patient was advised to call back or seek an in-person evaluation if the symptoms worsen or if the condition fails to improve as anticipated.  Time:  I spent 10 minutes with the patient via telehealth technology discussing the above problems/concerns.    Dorothy Climes, PA-C

## 2023-03-12 NOTE — Patient Instructions (Signed)
Dorothy Whitaker, thank you for joining [name], PA-C for today's virtual visit.  While this provider is not your primary care provider (PCP), if your PCP is located in our provider database this encounter information will be shared with them immediately following your visit.   A [facility] MyChart account gives you access to today's visit and all your visits, tests, and labs performed at [facility] " click here if you don't have a [facility] MyChart account or go to [email or web address]  Consent: (Patient) Dorothy Whitaker provided verbal consent for this virtual visit at the beginning of the encounter.  Current Medications:  Current Outpatient Medications:    fluconazole (DIFLUCAN) 150 MG tablet, Take 1 tablet (150 mg total) by mouth once for 1 dose., Disp: 1 tablet, Rfl: 0   albuterol (VENTOLIN HFA) 108 (90 Base) MCG/ACT inhaler, Inhale 2 puffs into the lungs every 6 (six) hours as needed for wheezing or shortness of breath., Disp: 8 g, Rfl: 0   Blood Glucose Monitoring Suppl DEVI, 1 each by Does not apply route in the morning, at noon, and at bedtime. May substitute to any manufacturer covered by patient's insurance., Disp: 1 each, Rfl: 0   clonazePAM (KLONOPIN) 0.5 MG tablet, Take 1 tablet (0.5 mg total) by mouth 2 (two) times daily as needed for anxiety., Disp: 20 tablet, Rfl: 0   ferrous sulfate 300 (60 Fe) MG/5ML syrup, Take 5 mLs (300 mg total) by mouth daily., Disp: 450 mL, Rfl: 3   ferrous sulfate 325 (65 FE) MG EC tablet, TAKE 1 TABLET BY MOUTH TWICE A DAY (Patient not taking: Reported on 08/06/2022), Disp: 180 tablet, Rfl: 1   FLUoxetine (PROZAC) 20 MG capsule, Take 1 capsule (20 mg total) by mouth daily., Disp: 30 capsule, Rfl: 0   hydrOXYzine (ATARAX) 10 MG tablet, Take 1 tablet (10 mg total) by mouth 3 (three) times daily as needed for anxiety., Disp: 30 tablet, Rfl: 0   metFORMIN (GLUCOPHAGE) 500 MG tablet, Take 1 tablet (500 mg total) by mouth  daily with breakfast., Disp: 30 tablet, Rfl: 1   Vitamin D, Ergocalciferol, (DRISDOL) 1.25 MG (50000 UNIT) CAPS capsule, Take 1 capsule (50,000 Units total) by mouth every 7 (seven) days. (Patient not taking: Reported on 08/06/2022), Disp: 12 capsule, Rfl: 0   Medications ordered in this encounter:  Meds ordered this encounter  Medications   fluconazole (DIFLUCAN) 150 MG tablet    Sig: Take 1 tablet (150 mg total) by mouth once for 1 dose.    Dispense:  1 tablet    Refill:  0    Dropped pill from last script so was unable to take.    Order Specific Question:   Supervising Provider    Answer:   Merrilee Jansky [[id number]]     *If you need refills on other medications prior to your next appointment, please contact your pharmacy*  Follow-Up: Call back or seek an in-person evaluation if the symptoms worsen or if the condition fails to improve as anticipated.  [facility] [phone]  Other Instructions Vaginitis  Vaginitis is irritation and swelling of the vagina. Treatment will depend on the cause. What are the causes? It can be caused by: Bacteria. Yeast. A parasite. A virus. Low hormone levels. Bubble baths, scented tampons, and feminine sprays. Other things can change the balance of the yeast and bacteria that live in the vagina. These include: Antibiotic medicines. Not being clean enough. Some birth control methods. Sex.  Infection. Diabetes. A weakened body defense system (immune system). What increases the risk? Smoking or being around someone who smokes. Using washes (douches), scented tampons, or scented pads. Wearing tight pants or thong underwear. Using birth control pills or an IUD. Having sex without a condom or having a lot of partners. Having an STI. Using a certain product to kill sperm (nonoxynol-9). Eating foods that are high in sugar. Having diabetes. Having low levels of a female hormone. Having a weakened body defense system. Being  pregnant or breastfeeding. What are the signs or symptoms? Fluid coming from the vagina that is not normal. A bad smell. Itching, pain, or swelling. Pain with sex. Pain or burning when you pee (urinate). Sometimes there are no symptoms. How is this treated? Treatment may include: Antibiotic creams or pills. Antifungal medicines. Medicines to ease symptoms if you have a virus. Your sex partner should also be treated. Estrogen medicines. Avoiding scented soaps, sprays, or douches. Stopping use of products that caused irritation and then using a cream to treat symptoms. Follow these instructions at home: Lifestyle Keep the area around your vagina clean and dry. Avoid using soap. Rinse the area with water. Until your doctor says it is okay: Do not use washes for the vagina. Do not use tampons. Do not have sex. Wipe from front to back after going to the bathroom. When your doctor says it is okay, practice safe sex and use condoms. General instructions Take over-the-counter and prescription medicines only as told by your doctor. If you were prescribed an antibiotic medicine, take or use it as told by your doctor. Do not stop taking or using it even if you start to feel better. Keep all follow-up visits. How is this prevented? Do not use things that can irritate the vagina, such as fabric softeners. Avoid these products if they are scented: Sprays. Detergents. Tampons. Products for cleaning the vagina. Soaps or bubble baths. Let air reach your vagina. To do this: Wear cotton underwear. Do not wear: Underwear while you sleep. Tight pants. Thong underwear. Underwear or nylons without a cotton panel. Take off any wet clothing, such as bathing suits, as soon as you can. Practice safe sex and use condoms. Contact a doctor if: You have pain in your belly or in the area between your hips. You have a fever or chills. Your symptoms last for more than 2-3 days. Get help right away  if: You have a fever and your symptoms get worse all of a sudden. Summary Vaginitis is irritation and swelling of the vagina. Treatment will depend on the cause of the condition. Do not use washes or tampons or have sex until your doctor says it is okay. This information is not intended to replace advice given to you by your health care provider. Make sure you discuss any questions you have with your health care provider. Document Revised: 04/19/2020 Document Reviewed: 04/19/2020 Elsevier Patient Education  2023 Elsevier Inc.    If you have been instructed to have an in-person evaluation today at a local Urgent Care facility, please use the link below. It will take you to a list of all of our available [facility], including address, phone number and hours of operation. Please do not delay care.  [facility]  If you or a family member do not have a primary care provider, use the link below to schedule a visit and establish care. When you choose a [facility] primary care physician or advanced  practice provider, you gain a long-term partner in health. Find a Primary Care Provider  Learn more about 's in-office and virtual care options: [facility]

## 2023-03-12 NOTE — Progress Notes (Signed)
Because of continued symptoms despite recent treatments via e-visit, I feel your condition warrants further evaluation and I recommend that you be seen in a face to face visit.   NOTE: There will be NO CHARGE for this eVisit   If you are having a true medical emergency please call 911.      For an urgent face to face visit, [facility] has eight urgent care centers for your convenience:   NEW!! [facility] at [facility] Get Driving Directions [phone] [address] [place], [address]    [facility] Urgent Care Center at [facility] Get Driving Directions [phone] [address] [place], [place] [address]   [facility] Health Urgent Care Center [facility]) Get Driving Directions [phone] [address] [place], [place] [address]  [facility] Health Urgent Care Center [facility]) Get Driving Directions [phone] [address] [place],  [place]  [address]  [facility] Center [facility] at [employer]  [phone] [address] [place],  [place] [address]   [facility] Health Urgent Care at [facility] Get Driving Directions [phone] [address] [place], [place] [address]   [facility] Health Urgent Care at [facility] Get Driving Directions  [phone] [address] [place], [place] [address]   [facility] Health Urgent Care at [facility] Directions [phone] [address] [place], [place] [address]  Your MyChart E-visit questionnaire answers were reviewed by a board certified advanced clinical practitioner to complete your personal care plan based on your specific symptoms.  Thank you for using e-Visits.

## 2023-03-13 ENCOUNTER — Telehealth: Payer: Self-pay | Admitting: Family Medicine

## 2023-03-13 DIAGNOSIS — N898 Other specified noninflammatory disorders of vagina: Secondary | ICD-10-CM

## 2023-03-13 MED ORDER — MONISTAT 1 COMBO PACK 1200 & 2 MG & % VA KIT: 1.0000 | PACK | Freq: Once | VAGINAL | 0 refills | Status: AC

## 2023-03-13 NOTE — Patient Instructions (Signed)

## 2023-03-13 NOTE — Progress Notes (Signed)
Virtual Visit Consent   Dorothy Whitaker, you are scheduled for a virtual visit with a [facility] provider today. Just as with appointments in the office, your consent must be obtained to participate. Your consent will be active for this visit and any virtual visit you may have with one of our providers in the next 365 days. If you have a MyChart account, a copy of this consent can be sent to you electronically.  As this is a virtual visit, video technology does not allow for your provider to perform a traditional examination. This may limit your provider's ability to fully assess your condition. If your provider identifies any concerns that need to be evaluated in person or the need to arrange testing (such as labs, EKG, etc.), we will make arrangements to do so. Although advances in technology are sophisticated, we cannot ensure that it will always work on either your end or our end. If the connection with a video visit is poor, the visit may have to be switched to a telephone visit. With either a video or telephone visit, we are not always able to ensure that we have a secure connection.  By engaging in this virtual visit, you consent to the provision of healthcare and authorize for your insurance to be billed (if applicable) for the services provided during this visit. Depending on your insurance coverage, you may receive a charge related to this service.  I need to obtain your verbal consent now. Are you willing to proceed with your visit today? Dorothy Whitaker has provided verbal consent on 03/13/2023 for a virtual visit (video or telephone). Georgana Curio, FNP  Date: 03/13/2023 2:41 PM  Virtual Visit via Video Note   I, Georgana Curio, connected with  Dorothy Whitaker  ([id number], 04-24-1996) on 03/13/23 at  2:30 PM EDT by a video-enabled telemedicine application and verified that I am speaking with the correct person using two identifiers.  Location: Patient: Virtual Visit Location Patient:  Home Provider: Virtual Visit Location Provider: Home Office   I discussed the limitations of evaluation and management by telemedicine and the availability of in person appointments. The patient expressed understanding and agreed to proceed.    History of Present Illness: Dorothy Whitaker is a 27 y.o. who identifies as a female who was assigned female at birth, and is being seen today for external vaginal itching from a new soap. No discharge, odor or abd pain. No urinary sx. She also sprayed perfume to the area which sh efeels also contributed to the external irritation. Marland Kitchen  HPI: HPI  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   Panic attack 06/26/2021   Symptomatic anemia 11/15/2019   Heavy menstrual bleeding 11/15/2019   Asthma    Acanthosis nigricans 09/21/2015   Body mass index (BMI) of 36.0-36.9 in adult 07/17/2015    Allergies: No Known Allergies Medications:  Current Outpatient Medications:    miconazole (MONISTAT 1 COMBO PACK) kit, Place 1 each vaginally once for 1 dose., Disp: 1 each, Rfl: 0   albuterol (VENTOLIN HFA) 108 (90 Base) MCG/ACT inhaler, Inhale 2 puffs into the lungs every 6 (six) hours as needed for wheezing or shortness of breath., Disp: 8 g, Rfl: 0   Blood Glucose Monitoring Suppl DEVI, 1 each by Does not apply route in the morning, at noon, and at bedtime. May substitute to any manufacturer covered by patient's insurance.,  Disp: 1 each, Rfl: 0   clonazePAM (KLONOPIN) 0.5 MG tablet, Take 1 tablet (0.5 mg total) by mouth 2 (two) times daily as needed for anxiety., Disp: 20 tablet, Rfl: 0   ferrous sulfate 300 (60 Fe) MG/5ML syrup, Take 5 mLs (300 mg total) by mouth daily., Disp: 450 mL, Rfl: 3   ferrous sulfate 325 (65 FE) MG EC tablet, TAKE 1 TABLET BY MOUTH TWICE A DAY (Patient not taking: Reported on 08/06/2022), Disp: 180 tablet, Rfl: 1   FLUoxetine (PROZAC) 20 MG  capsule, Take 1 capsule (20 mg total) by mouth daily., Disp: 30 capsule, Rfl: 0   hydrOXYzine (ATARAX) 10 MG tablet, Take 1 tablet (10 mg total) by mouth 3 (three) times daily as needed for anxiety., Disp: 30 tablet, Rfl: 0   metFORMIN (GLUCOPHAGE) 500 MG tablet, Take 1 tablet (500 mg total) by mouth daily with breakfast., Disp: 30 tablet, Rfl: 1   Vitamin D, Ergocalciferol, (DRISDOL) 1.25 MG (50000 UNIT) CAPS capsule, Take 1 capsule (50,000 Units total) by mouth every 7 (seven) days. (Patient not taking: Reported on 08/06/2022), Disp: 12 capsule, Rfl: 0  Observations/Objective: Patient is well-developed, well-nourished in no acute distress.  Resting comfortably  at home.  Head is normocephalic, atraumatic.  No labored breathing.  Speech is clear and coherent with logical content.  Patient is alert and oriented at baseline.    Assessment and Plan: 1. Vaginal irritation  Keep area clean and dry. UC if sx persist for exam.   Follow Up Instructions: I discussed the assessment and treatment plan with the patient. The patient was provided an opportunity to ask questions and all were answered. The patient agreed with the plan and demonstrated an understanding of the instructions.  A copy of instructions were sent to the patient via MyChart unless otherwise noted below.     The patient was advised to call back or seek an in-person evaluation if the symptoms worsen or if the condition fails to improve as anticipated.  Time:  I spent 10 minutes with the patient via telehealth technology discussing the above problems/concerns.    Georgana Curio, FNP

## 2023-06-30 ENCOUNTER — Encounter (INDEPENDENT_AMBULATORY_CARE_PROVIDER_SITE_OTHER): Payer: Self-pay

## 2023-08-05 ENCOUNTER — Telehealth: Payer: Self-pay | Admitting: Physician Assistant

## 2023-08-05 DIAGNOSIS — J302 Other seasonal allergic rhinitis: Secondary | ICD-10-CM

## 2023-08-05 MED ORDER — AZELASTINE HCL 0.1 % NA SOLN: 1.0000 | Freq: Two times a day (BID) | NASAL | 0 refills | Status: AC

## 2023-08-05 MED ORDER — LEVOCETIRIZINE DIHYDROCHLORIDE 5 MG PO TABS: 5.0000 mg | ORAL_TABLET | Freq: Every evening | ORAL | 0 refills | Status: DC

## 2023-08-05 NOTE — Progress Notes (Signed)
Virtual Visit Consent   Dorothy Whitaker, you are scheduled for a virtual visit with a [facility] provider today. Just as with appointments in the office, your consent must be obtained to participate. Your consent will be active for this visit and any virtual visit you may have with one of our providers in the next 365 days. If you have a MyChart account, a copy of this consent can be sent to you electronically.  As this is a virtual visit, video technology does not allow for your provider to perform a traditional examination. This may limit your provider's ability to fully assess your condition. If your provider identifies any concerns that need to be evaluated in person or the need to arrange testing (such as labs, EKG, etc.), we will make arrangements to do so. Although advances in technology are sophisticated, we cannot ensure that it will always work on either your end or our end. If the connection with a video visit is poor, the visit may have to be switched to a telephone visit. With either a video or telephone visit, we are not always able to ensure that we have a secure connection.  By engaging in this virtual visit, you consent to the provision of healthcare and authorize for your insurance to be billed (if applicable) for the services provided during this visit. Depending on your insurance coverage, you may receive a charge related to this service.  I need to obtain your verbal consent now. Are you willing to proceed with your visit today? Dorothy Whitaker has provided verbal consent on 08/05/2023 for a virtual visit (video or telephone). [name], [place]  Date: 08/05/2023 5:23 PM  Virtual Visit via Video Note   I, [name], connected with  Dorothy Whitaker  ([id number], 1996/04/08) on 08/05/23 at  5:15 PM EDT by a video-enabled telemedicine application and verified that I am speaking with the correct person using two identifiers.  Location: Patient: Virtual Visit  Location Patient: Home Provider: Virtual Visit Location Provider: Home Office   I discussed the limitations of evaluation and management by telemedicine and the availability of in person appointments. The patient expressed understanding and agreed to proceed.    History of Present Illness: Dorothy Whitaker is a 27 y.o. who identifies as a female who was assigned female at birth, and is being seen today for increased allergy symptoms over the past few weeks with seasonal changes. Notes nasal congestion, rhinorrhea and PND. Denies fever, chills. Occasional cough from drainage. Some itchy eyes, worse in the morning when it happens. Denies recent travel or sick contact. Notes she typically has a bad time with fall allergies.   HPI: HPI  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   Panic attack 06/26/2021   Symptomatic anemia 11/15/2019   Heavy menstrual bleeding 11/15/2019   Asthma    Acanthosis nigricans 09/21/2015   Body mass index (BMI) of 36.0-36.9 in adult 07/17/2015    Allergies: No Known Allergies Medications:  Current Outpatient Medications:    azelastine (ASTELIN) 0.1 % nasal spray, Place 1 spray into both nostrils 2 (two) times daily. Use in each nostril as directed, Disp: 30 mL, Rfl: 0   levocetirizine (XYZAL) 5 MG tablet, Take 1 tablet (5 mg total) by mouth every evening., Disp: 30 tablet, Rfl: 0   albuterol (VENTOLIN HFA) 108 (90 Base) MCG/ACT inhaler, Inhale 2 puffs into  the lungs every 6 (six) hours as needed for wheezing or shortness of breath., Disp: 8 g, Rfl: 0   Blood Glucose Monitoring Suppl DEVI, 1 each by Does not apply route in the morning, at noon, and at bedtime. May substitute to any manufacturer covered by patient's insurance., Disp: 1 each, Rfl: 0   clonazePAM (KLONOPIN) 0.5 MG tablet, Take 1 tablet (0.5 mg total) by mouth 2 (two) times daily as needed for anxiety.,  Disp: 20 tablet, Rfl: 0   ferrous sulfate 300 (60 Fe) MG/5ML syrup, Take 5 mLs (300 mg total) by mouth daily., Disp: 450 mL, Rfl: 3   ferrous sulfate 325 (65 FE) MG EC tablet, TAKE 1 TABLET BY MOUTH TWICE A DAY (Patient not taking: Reported on 08/06/2022), Disp: 180 tablet, Rfl: 1   FLUoxetine (PROZAC) 20 MG capsule, Take 1 capsule (20 mg total) by mouth daily., Disp: 30 capsule, Rfl: 0   hydrOXYzine (ATARAX) 10 MG tablet, Take 1 tablet (10 mg total) by mouth 3 (three) times daily as needed for anxiety., Disp: 30 tablet, Rfl: 0   metFORMIN (GLUCOPHAGE) 500 MG tablet, Take 1 tablet (500 mg total) by mouth daily with breakfast., Disp: 30 tablet, Rfl: 1   Vitamin D, Ergocalciferol, (DRISDOL) 1.25 MG (50000 UNIT) CAPS capsule, Take 1 capsule (50,000 Units total) by mouth every 7 (seven) days. (Patient not taking: Reported on 08/06/2022), Disp: 12 capsule, Rfl: 0  Observations/Objective: Patient is well-developed, well-nourished in no acute distress.  Resting comfortably at home.  Head is normocephalic, atraumatic.  No labored breathing. Speech is clear and coherent with logical content.  Patient is alert and oriented at baseline.   Assessment and Plan: 1. Seasonal allergic rhinitis, unspecified trigger - azelastine (ASTELIN) 0.1 % nasal spray; Place 1 spray into both nostrils 2 (two) times daily. Use in each nostril as directed  Dispense: 30 mL; Refill: 0 - levocetirizine (XYZAL) 5 MG tablet; Take 1 tablet (5 mg total) by mouth every evening.  Dispense: 30 tablet; Refill: 0  Supportive measures and OTC medications reviewed. Will add on Xyzal and Astelin per orders.   Follow Up Instructions: I discussed the assessment and treatment plan with the patient. The patient was provided an opportunity to ask questions and all were answered. The patient agreed with the plan and demonstrated an understanding of the instructions.  A copy of instructions were sent to the patient via MyChart unless otherwise  noted below.   The patient was advised to call back or seek an in-person evaluation if the symptoms worsen or if the condition fails to improve as anticipated.  Time:  I spent 10 minutes with the patient via telehealth technology discussing the above problems/concerns.    [name], PA-C

## 2023-08-05 NOTE — Patient Instructions (Signed)
Dorothy Whitaker, thank you for joining [name], PA-C for today's virtual visit.  While this provider is not your primary care provider (PCP), if your PCP is located in our provider database this encounter information will be shared with them immediately following your visit.   A [facility] MyChart account gives you access to today's visit and all your visits, tests, and labs performed at [facility] " click here if you don't have a [facility] MyChart account or go to [email or web address]  Consent: (Patient) Dorothy Whitaker provided verbal consent for this virtual visit at the beginning of the encounter.  Current Medications:  Current Outpatient Medications:    albuterol (VENTOLIN HFA) 108 (90 Base) MCG/ACT inhaler, Inhale 2 puffs into the lungs every 6 (six) hours as needed for wheezing or shortness of breath., Disp: 8 g, Rfl: 0   Blood Glucose Monitoring Suppl DEVI, 1 each by Does not apply route in the morning, at noon, and at bedtime. May substitute to any manufacturer covered by patient's insurance., Disp: 1 each, Rfl: 0   clonazePAM (KLONOPIN) 0.5 MG tablet, Take 1 tablet (0.5 mg total) by mouth 2 (two) times daily as needed for anxiety., Disp: 20 tablet, Rfl: 0   ferrous sulfate 300 (60 Fe) MG/5ML syrup, Take 5 mLs (300 mg total) by mouth daily., Disp: 450 mL, Rfl: 3   ferrous sulfate 325 (65 FE) MG EC tablet, TAKE 1 TABLET BY MOUTH TWICE A DAY (Patient not taking: Reported on 08/06/2022), Disp: 180 tablet, Rfl: 1   FLUoxetine (PROZAC) 20 MG capsule, Take 1 capsule (20 mg total) by mouth daily., Disp: 30 capsule, Rfl: 0   hydrOXYzine (ATARAX) 10 MG tablet, Take 1 tablet (10 mg total) by mouth 3 (three) times daily as needed for anxiety., Disp: 30 tablet, Rfl: 0   metFORMIN (GLUCOPHAGE) 500 MG tablet, Take 1 tablet (500 mg total) by mouth daily with breakfast., Disp: 30 tablet, Rfl: 1   Vitamin D, Ergocalciferol, (DRISDOL) 1.25 MG (50000 UNIT) CAPS capsule,  Take 1 capsule (50,000 Units total) by mouth every 7 (seven) days. (Patient not taking: Reported on 08/06/2022), Disp: 12 capsule, Rfl: 0   Medications ordered in this encounter:  No orders of the defined types were placed in this encounter.    *If you need refills on other medications prior to your next appointment, please contact your pharmacy*  Follow-Up: Call back or seek an in-person evaluation if the symptoms worsen or if the condition fails to improve as anticipated.  [facility] [phone]  Other Instructions Allergic Rhinitis, Adult  Allergic rhinitis is a reaction to allergens. Allergens are things that can cause an allergic reaction. This condition affects the lining inside the nose (mucous membrane). There are two types of allergic rhinitis: Seasonal. This type is also called hay fever. It happens only during some times of the year. Perennial. This type can happen at any time of the year. This condition cannot be spread from person to person (is not contagious). It can be mild, bad, or very bad. It can develop at any age and may be outgrown. What are the causes? Pollen from grasses, trees, and weeds. Other causes can be: Dust mites. Smoke. Mold. Car fumes. The pee (urine), spit, or dander of pets. Dander is dead skin cells from a pet. What increases the risk? You are more likely to develop this condition if: You have allergies in your family. You have problems like allergies in your family. You  may have: Swelling of parts of your eyes and eyelids. Asthma. This affects how you breathe. Long-term redness and swelling on your skin. Food allergies. What are the signs or symptoms? The main symptom of this condition is a runny or stuffy nose (nasal congestion). Other symptoms may include: Sneezing or coughing. Itching and tearing of your eyes. Mucus that drips down the back of your throat (postnasal drip). This may cause a sore throat. Trouble  sleeping. Feeling tired. Headache. How is this treated? There is no cure for this condition. You should avoid things that you are allergic to. Treatment can help to relieve symptoms. This may include: Medicines that block allergy symptoms, such as anti-inflammatories or antihistamines. These may be given as a shot, nasal spray, or pill. Avoiding things you are allergic to. Medicines that give you some of what you are allergic to over time. This is called immunotherapy. It is done if other treatments do not help. You may get: Shots. Medicine under your tongue. Stronger medicines, if other treatments do not help. Follow these instructions at home: Avoiding allergens Find out what things you are allergic to and avoid them. To do this, try these things: If you get allergies any time of year: Replace carpet with wood, tile, or vinyl flooring. Carpet can trap pet dander and dust. Do not smoke. Do not allow smoking in your home. Change your heating and air conditioning filters at least once a month. If you get allergies only some times of the year: Keep windows closed when you can. Plan things to do outside when pollen counts are lowest. Check pollen counts before you plan things to do outside. When you come indoors, change your clothes and shower before you sit on furniture or bedding. If you are allergic to a pet: Keep the pet out of your bedroom. Vacuum, sweep, and dust often. General instructions Take over-the-counter and prescription medicines only as told by your doctor. Drink enough fluid to keep your pee pale yellow. Where to find more information American Academy of Allergy, Asthma & Immunology: aaaai.org Contact a doctor if: You have a fever. You get a cough that does not go away. You make high-pitched whistling sounds when you breathe, most often when you breathe out (wheeze). Your symptoms slow you down. Your symptoms stop you from doing your normal things each day. Get help  right away if: You are short of breath. This symptom may be an emergency. Get help right away. Call 911. Do not wait to see if the symptom will go away. Do not drive yourself to the hospital. This information is not intended to replace advice given to you by your health care provider. Make sure you discuss any questions you have with your health care provider. Document Revised: 06/30/2022 Document Reviewed: 06/30/2022 Elsevier Patient Education  2024 Elsevier Inc.    If you have been instructed to have an in-person evaluation today at a local Urgent Care facility, please use the link below. It will take you to a list of all of our available [facility], including address, phone number and hours of operation. Please do not delay care.  [facility]  If you or a family member do not have a primary care provider, use the link below to schedule a visit and establish care. When you choose a [facility] primary care physician or advanced practice provider, you gain a long-term partner in health. Find a Primary Care Provider  Learn more about [facility]  in-office and virtual care options: [facility] Get Care Now

## 2023-08-07 ENCOUNTER — Telehealth: Payer: Self-pay | Admitting: Family Medicine

## 2023-08-07 NOTE — Progress Notes (Signed)
Pt did not show for [id number] 

## 2023-08-08 ENCOUNTER — Telehealth: Payer: Self-pay | Admitting: Family Medicine

## 2023-08-08 DIAGNOSIS — R21 Rash and other nonspecific skin eruption: Secondary | ICD-10-CM

## 2023-08-08 MED ORDER — HYDROCORTISONE 1 % EX OINT: 1.0000 | TOPICAL_OINTMENT | Freq: Two times a day (BID) | CUTANEOUS | 0 refills | Status: AC

## 2023-08-08 NOTE — Progress Notes (Signed)
Virtual Visit Consent   Dorothy Whitaker, you are scheduled for a virtual visit with a [facility] provider today. Just as with appointments in the office, your consent must be obtained to participate. Your consent will be active for this visit and any virtual visit you may have with one of our providers in the next 365 days. If you have a MyChart account, a copy of this consent can be sent to you electronically.  As this is a virtual visit, video technology does not allow for your provider to perform a traditional examination. This may limit your provider's ability to fully assess your condition. If your provider identifies any concerns that need to be evaluated in person or the need to arrange testing (such as labs, EKG, etc.), we will make arrangements to do so. Although advances in technology are sophisticated, we cannot ensure that it will always work on either your end or our end. If the connection with a video visit is poor, the visit may have to be switched to a telephone visit. With either a video or telephone visit, we are not always able to ensure that we have a secure connection.  By engaging in this virtual visit, you consent to the provision of healthcare and authorize for your insurance to be billed (if applicable) for the services provided during this visit. Depending on your insurance coverage, you may receive a charge related to this service.  I need to obtain your verbal consent now. Are you willing to proceed with your visit today? Dorothy Whitaker has provided verbal consent on 08/08/2023 for a virtual visit (video or telephone). Reed Pandy, [place]  Date: 08/08/2023 9:52 AM  Virtual Visit via Video Note   I, Reed Pandy, connected with  Dorothy Whitaker  ([id number], 1996/07/17) on 08/08/23 at  9:30 AM EDT by a video-enabled telemedicine application and verified that I am speaking with the correct person using two identifiers.  Location: Patient: Virtual Visit Location  Patient: Home Provider: Virtual Visit Location Provider: Home Office   I discussed the limitations of evaluation and management by telemedicine and the availability of in person appointments. The patient expressed understanding and agreed to proceed.    History of Present Illness: Dorothy Whitaker is a 27 y.o. who identifies as a female who was assigned female at birth, and is being seen today for c/o I was wondering if there is a cream for a rash that I have.  I mixed up my soap and I used a different soap and now I have a rash.  Pt states the rash is in inner thighs and private area.  Pt states the rash is itchy and red.  Pt states rash started yesterday. Pt does not report discharge.   HPI: HPI  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   Panic attack 06/26/2021   Symptomatic anemia 11/15/2019   Heavy menstrual bleeding 11/15/2019   Asthma    Acanthosis nigricans 09/21/2015   Body mass index (BMI) of 36.0-36.9 in adult 07/17/2015    Allergies: No Known Allergies Medications:  Current Outpatient Medications:    hydrocortisone 1 % ointment, Apply 1 Application topically 2 (two) times daily for 10 days., Disp: 30 g, Rfl: 0   albuterol (VENTOLIN HFA) 108 (90 Base) MCG/ACT inhaler, Inhale 2 puffs into the lungs every 6 (six) hours as needed for wheezing or shortness of breath., Disp: 8  g, Rfl: 0   azelastine (ASTELIN) 0.1 % nasal spray, Place 1 spray into both nostrils 2 (two) times daily. Use in each nostril as directed, Disp: 30 mL, Rfl: 0   Blood Glucose Monitoring Suppl DEVI, 1 each by Does not apply route in the morning, at noon, and at bedtime. May substitute to any manufacturer covered by patient's insurance., Disp: 1 each, Rfl: 0   clonazePAM (KLONOPIN) 0.5 MG tablet, Take 1 tablet (0.5 mg total) by mouth 2 (two) times daily as needed for anxiety., Disp: 20 tablet, Rfl: 0   ferrous  sulfate 300 (60 Fe) MG/5ML syrup, Take 5 mLs (300 mg total) by mouth daily., Disp: 450 mL, Rfl: 3   ferrous sulfate 325 (65 FE) MG EC tablet, TAKE 1 TABLET BY MOUTH TWICE A DAY (Patient not taking: Reported on 08/06/2022), Disp: 180 tablet, Rfl: 1   FLUoxetine (PROZAC) 20 MG capsule, Take 1 capsule (20 mg total) by mouth daily., Disp: 30 capsule, Rfl: 0   hydrOXYzine (ATARAX) 10 MG tablet, Take 1 tablet (10 mg total) by mouth 3 (three) times daily as needed for anxiety., Disp: 30 tablet, Rfl: 0   levocetirizine (XYZAL) 5 MG tablet, Take 1 tablet (5 mg total) by mouth every evening., Disp: 30 tablet, Rfl: 0   metFORMIN (GLUCOPHAGE) 500 MG tablet, Take 1 tablet (500 mg total) by mouth daily with breakfast., Disp: 30 tablet, Rfl: 1   Vitamin D, Ergocalciferol, (DRISDOL) 1.25 MG (50000 UNIT) CAPS capsule, Take 1 capsule (50,000 Units total) by mouth every 7 (seven) days. (Patient not taking: Reported on 08/06/2022), Disp: 12 capsule, Rfl: 0  Observations/Objective: Patient is well-developed, well-nourished in no acute distress.  Resting comfortably at home.  Head is normocephalic, atraumatic.  No labored breathing. Speech is clear and coherent with logical content.  Patient is alert and oriented at baseline.    Assessment and Plan: 1. Rash - hydrocortisone 1 % ointment; Apply 1 Application topically 2 (two) times daily for 10 days.  Dispense: 30 g; Refill: 0  -Start Hydrocortisone for rash  -Advised Pt to F/U with PCP or urgent care for worsening symptoms  -Pt verbalized understanding.    Follow Up Instructions: I discussed the assessment and treatment plan with the patient. The patient was provided an opportunity to ask questions and all were answered. The patient agreed with the plan and demonstrated an understanding of the instructions.  A copy of instructions were sent to the patient via MyChart unless otherwise noted below.     The patient was advised to call back or seek an in-person  evaluation if the symptoms worsen or if the condition fails to improve as anticipated.  Time:  I spent 15 minutes with the patient via telehealth technology discussing the above problems/concerns.    Reed Pandy, PA-C

## 2023-08-08 NOTE — Patient Instructions (Signed)
Dorothy Whitaker, thank you for joining Reed Pandy, PA-C for today's virtual visit.  While this provider is not your primary care provider (PCP), if your PCP is located in our provider database this encounter information will be shared with them immediately following your visit.   A [facility] MyChart account gives you access to today's visit and all your visits, tests, and labs performed at [facility] " click here if you don't have a [facility] MyChart account or go to [email or web address]  Consent: (Patient) Dorothy Whitaker provided verbal consent for this virtual visit at the beginning of the encounter.  Current Medications:  Current Outpatient Medications:    hydrocortisone 1 % ointment, Apply 1 Application topically 2 (two) times daily for 10 days., Disp: 30 g, Rfl: 0   albuterol (VENTOLIN HFA) 108 (90 Base) MCG/ACT inhaler, Inhale 2 puffs into the lungs every 6 (six) hours as needed for wheezing or shortness of breath., Disp: 8 g, Rfl: 0   azelastine (ASTELIN) 0.1 % nasal spray, Place 1 spray into both nostrils 2 (two) times daily. Use in each nostril as directed, Disp: 30 mL, Rfl: 0   Blood Glucose Monitoring Suppl DEVI, 1 each by Does not apply route in the morning, at noon, and at bedtime. May substitute to any manufacturer covered by patient's insurance., Disp: 1 each, Rfl: 0   clonazePAM (KLONOPIN) 0.5 MG tablet, Take 1 tablet (0.5 mg total) by mouth 2 (two) times daily as needed for anxiety., Disp: 20 tablet, Rfl: 0   ferrous sulfate 300 (60 Fe) MG/5ML syrup, Take 5 mLs (300 mg total) by mouth daily., Disp: 450 mL, Rfl: 3   ferrous sulfate 325 (65 FE) MG EC tablet, TAKE 1 TABLET BY MOUTH TWICE A DAY (Patient not taking: Reported on 08/06/2022), Disp: 180 tablet, Rfl: 1   FLUoxetine (PROZAC) 20 MG capsule, Take 1 capsule (20 mg total) by mouth daily., Disp: 30 capsule, Rfl: 0   hydrOXYzine (ATARAX) 10 MG tablet, Take 1 tablet (10 mg total) by mouth 3 (three)  times daily as needed for anxiety., Disp: 30 tablet, Rfl: 0   levocetirizine (XYZAL) 5 MG tablet, Take 1 tablet (5 mg total) by mouth every evening., Disp: 30 tablet, Rfl: 0   metFORMIN (GLUCOPHAGE) 500 MG tablet, Take 1 tablet (500 mg total) by mouth daily with breakfast., Disp: 30 tablet, Rfl: 1   Vitamin D, Ergocalciferol, (DRISDOL) 1.25 MG (50000 UNIT) CAPS capsule, Take 1 capsule (50,000 Units total) by mouth every 7 (seven) days. (Patient not taking: Reported on 08/06/2022), Disp: 12 capsule, Rfl: 0   Medications ordered in this encounter:  Meds ordered this encounter  Medications   hydrocortisone 1 % ointment    Sig: Apply 1 Application topically 2 (two) times daily for 10 days.    Dispense:  30 g    Refill:  0     *If you need refills on other medications prior to your next appointment, please contact your pharmacy*  Follow-Up: Call back or seek an in-person evaluation if the symptoms worsen or if the condition fails to improve as anticipated.  [facility] [phone]  Other Instructions Rash, Adult A rash is a breakout of spots or blotches on the skin. It can affect the way the skin looks and feels. Many things can cause a rash. Common causes include: Viral infections. These include colds, measles, and hand, foot, and mouth disease. Bacterial infections. These include scarlet fever and impetigo. Fungal infections. These include  athlete's foot, ringworm, and yeast rashes. Skin irritation. This may be from heat rash, exposure to moisture or friction for a long time (intertrigo), or exposure to soap or skin care products (eczema). Allergic reactions. These may be caused by foods, medicines, or things like poison ivy. Some rashes may go away after a few days. Others may last for a few weeks. The goal of treatment is to stop the itching and keep the rash from spreading. Follow these instructions at home: Medicine Take or apply over-the-counter and prescription  medicines only as told by your health care provider. These may include: Corticosteroids. These can help treat red or swollen skin. They may be given as creams or as medicines to take by mouth (orally). Anti-itch lotions. Allergy medicines. Pain medicine. Antifungal medicine if the rash is from a fungal infection. Antibiotics if you have an infection.  Skin care Apply cool, wet cloths (compresses) to the affected areas. Do not scratch or rub your skin. Avoid covering the rash. Keep it exposed to air as often as you can. Managing itching and discomfort Avoid hot showers and baths. These can make itching worse. A cold shower may help. Try taking a bath with: Epsom salts. You can get these at your local pharmacy or grocery store. Follow the instructions on the package. Baking soda. Pour a small amount into the bath as told by your provider. Colloidal oatmeal. You can get this at your local pharmacy or grocery store. Follow the instructions on the package. Try putting baking soda paste on your skin. Stir water into baking soda until it becomes like a paste. Try using calamine lotion or cortisone cream to help with itchiness. Keep cool. Stay out of the sun. Sweating and being hot can make itching worse. General instructions  Rest as needed. Drink enough fluid to keep your pee (urine) pale yellow. Wear loose-fitting clothes. Avoid scented soaps, detergents, and perfumes. Use gentle soaps, detergents, perfumes, and cosmetics. Avoid the things that cause your rash (triggers). Keep a journal to help keep track of your triggers. Write down: What you eat. What cosmetics you use. What you drink. What you wear. This includes jewelry. Contact a health care provider if: You sweat at night more than normal. You pee (urinate) more or less than normal, or your pee is a darker color than normal. Your eyes become sensitive to light. Your skin or the white parts of your eyes turn yellow  (jaundice). Your skin tingles or is numb. You get painful blisters in your nose or mouth. Your rash does not go away after a few days, or it gets worse. You are more tired or thirsty than normal. You have new or worse symptoms. These may include: Pain in your abdomen. Fever. Diarrhea or vomiting. Weakness or weight loss. Get help right away if: You get confused. You have a severe headache, a stiff neck, or severe joint pain or stiffness. You become very sleepy or not responsive. You have a seizure. This information is not intended to replace advice given to you by your health care provider. Make sure you discuss any questions you have with your health care provider. Document Revised: 08/08/2022 Document Reviewed: 08/08/2022 Elsevier Patient Education  2024 Elsevier Inc.    If you have been instructed to have an in-person evaluation today at a local Urgent Care facility, please use the link below. It will take you to a list of all of our available [facility], including address, phone number and hours of  operation. Please do not delay care.  [facility]  If you or a family member do not have a primary care provider, use the link below to schedule a visit and establish care. When you choose a [facility] primary care physician or advanced practice provider, you gain a long-term partner in health. Find a Primary Care Provider  Learn more about [facility]'s in-office and virtual care options: [facility] - Get Care Now

## 2023-08-20 ENCOUNTER — Telehealth: Payer: Self-pay | Admitting: Physician Assistant

## 2023-08-20 DIAGNOSIS — B3731 Acute candidiasis of vulva and vagina: Secondary | ICD-10-CM

## 2023-08-20 MED ORDER — FLUCONAZOLE 150 MG PO TABS: ORAL_TABLET | ORAL | 0 refills | Status: DC

## 2023-08-20 NOTE — Progress Notes (Signed)
Virtual Visit Consent   Dorothy Whitaker, you are scheduled for a virtual visit with a [facility] provider today. Just as with appointments in the office, your consent must be obtained to participate. Your consent will be active for this visit and any virtual visit you may have with one of our providers in the next 365 days. If you have a MyChart account, a copy of this consent can be sent to you electronically.  As this is a virtual visit, video technology does not allow for your provider to perform a traditional examination. This may limit your provider's ability to fully assess your condition. If your provider identifies any concerns that need to be evaluated in person or the need to arrange testing (such as labs, EKG, etc.), we will make arrangements to do so. Although advances in technology are sophisticated, we cannot ensure that it will always work on either your end or our end. If the connection with a video visit is poor, the visit may have to be switched to a telephone visit. With either a video or telephone visit, we are not always able to ensure that we have a secure connection.  By engaging in this virtual visit, you consent to the provision of healthcare and authorize for your insurance to be billed (if applicable) for the services provided during this visit. Depending on your insurance coverage, you may receive a charge related to this service.  I need to obtain your verbal consent now. Are you willing to proceed with your visit today? Dorothy Whitaker has provided verbal consent on 08/20/2023 for a virtual visit (video or telephone). [name], [place]  Date: 08/20/2023 5:05 PM  Virtual Visit via Video Note   I, [name], connected with  Dorothy Whitaker  ([id number], 1996/01/17) on 08/20/23 at  5:00 PM EDT by a video-enabled telemedicine application and verified that I am speaking with the correct person using two identifiers.  Location: Patient: Virtual Visit  Location Patient: Home Provider: Virtual Visit Location Provider: Home Office   I discussed the limitations of evaluation and management by telemedicine and the availability of in person appointments. The patient expressed understanding and agreed to proceed.    History of Present Illness: Dorothy Whitaker is a 27 y.o. who identifies as a female who was assigned female at birth, and is being seen today for possible yeast infection. Notes starting OTC Monistat which is helping but not resolving the issue. Notes vaginal itching and discharge. Notes new soap as potential trigger. Denies fevers, chills, belly pain. LMP was 9/20. Denies concerns for pregnancy.  HPI: HPI  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   Panic attack 06/26/2021   Symptomatic anemia 11/15/2019   Heavy menstrual bleeding 11/15/2019   Asthma    Acanthosis nigricans 09/21/2015   Body mass index (BMI) of 36.0-36.9 in adult 07/17/2015    Allergies: No Known Allergies Medications:  Current Outpatient Medications:    fluconazole (DIFLUCAN) 150 MG tablet, Take 1 tablet PO once. Repeat in 3 days if needed., Disp: 2 tablet, Rfl: 0   albuterol (VENTOLIN HFA) 108 (90 Base) MCG/ACT inhaler, Inhale 2 puffs into the lungs every 6 (six) hours as needed for wheezing or shortness of breath., Disp: 8 g, Rfl: 0   azelastine (ASTELIN) 0.1 % nasal spray, Place 1 spray into both nostrils 2 (two) times daily. Use in each nostril  as directed, Disp: 30 mL, Rfl: 0   Blood Glucose Monitoring Suppl DEVI, 1 each by Does not apply route in the morning, at noon, and at bedtime. May substitute to any manufacturer covered by patient's insurance., Disp: 1 each, Rfl: 0   clonazePAM (KLONOPIN) 0.5 MG tablet, Take 1 tablet (0.5 mg total) by mouth 2 (two) times daily as needed for anxiety., Disp: 20 tablet, Rfl: 0   ferrous sulfate 300 (60 Fe) MG/5ML syrup,  Take 5 mLs (300 mg total) by mouth daily., Disp: 450 mL, Rfl: 3   ferrous sulfate 325 (65 FE) MG EC tablet, TAKE 1 TABLET BY MOUTH TWICE A DAY (Patient not taking: Reported on 08/06/2022), Disp: 180 tablet, Rfl: 1   FLUoxetine (PROZAC) 20 MG capsule, Take 1 capsule (20 mg total) by mouth daily., Disp: 30 capsule, Rfl: 0   hydrOXYzine (ATARAX) 10 MG tablet, Take 1 tablet (10 mg total) by mouth 3 (three) times daily as needed for anxiety., Disp: 30 tablet, Rfl: 0   levocetirizine (XYZAL) 5 MG tablet, Take 1 tablet (5 mg total) by mouth every evening., Disp: 30 tablet, Rfl: 0   metFORMIN (GLUCOPHAGE) 500 MG tablet, Take 1 tablet (500 mg total) by mouth daily with breakfast., Disp: 30 tablet, Rfl: 1   Vitamin D, Ergocalciferol, (DRISDOL) 1.25 MG (50000 UNIT) CAPS capsule, Take 1 capsule (50,000 Units total) by mouth every 7 (seven) days. (Patient not taking: Reported on 08/06/2022), Disp: 12 capsule, Rfl: 0  Observations/Objective: Patient is well-developed, well-nourished in no acute distress.  Resting comfortably at home.  Head is normocephalic, atraumatic.  No labored breathing. Speech is clear and coherent with logical content.  Patient is alert and oriented at baseline.   Assessment and Plan: 1. Yeast vaginitis - fluconazole (DIFLUCAN) 150 MG tablet; Take 1 tablet PO once. Repeat in 3 days if needed.  Dispense: 2 tablet; Refill: 0  Supportive measures and OTC medications reviewed. Diflucan 2-dose course per orders  Follow Up Instructions: I discussed the assessment and treatment plan with the patient. The patient was provided an opportunity to ask questions and all were answered. The patient agreed with the plan and demonstrated an understanding of the instructions.  A copy of instructions were sent to the patient via MyChart unless otherwise noted below.   The patient was advised to call back or seek an in-person evaluation if the symptoms worsen or if the condition fails to improve as  anticipated.    [name], PA-C

## 2023-08-20 NOTE — Patient Instructions (Signed)
Dorothy Whitaker, thank you for joining [name], PA-C for today's virtual visit.  While this provider is not your primary care provider (PCP), if your PCP is located in our provider database this encounter information will be shared with them immediately following your visit.   A [facility] MyChart account gives you access to today's visit and all your visits, tests, and labs performed at [facility] " click here if you don't have a [facility] MyChart account or go to [email or web address]  Consent: (Patient) Dorothy Whitaker provided verbal consent for this virtual visit at the beginning of the encounter.  Current Medications:  Current Outpatient Medications:    fluconazole (DIFLUCAN) 150 MG tablet, Take 1 tablet PO once. Repeat in 3 days if needed., Disp: 2 tablet, Rfl: 0   albuterol (VENTOLIN HFA) 108 (90 Base) MCG/ACT inhaler, Inhale 2 puffs into the lungs every 6 (six) hours as needed for wheezing or shortness of breath., Disp: 8 g, Rfl: 0   azelastine (ASTELIN) 0.1 % nasal spray, Place 1 spray into both nostrils 2 (two) times daily. Use in each nostril as directed, Disp: 30 mL, Rfl: 0   Blood Glucose Monitoring Suppl DEVI, 1 each by Does not apply route in the morning, at noon, and at bedtime. May substitute to any manufacturer covered by patient's insurance., Disp: 1 each, Rfl: 0   clonazePAM (KLONOPIN) 0.5 MG tablet, Take 1 tablet (0.5 mg total) by mouth 2 (two) times daily as needed for anxiety., Disp: 20 tablet, Rfl: 0   ferrous sulfate 300 (60 Fe) MG/5ML syrup, Take 5 mLs (300 mg total) by mouth daily., Disp: 450 mL, Rfl: 3   ferrous sulfate 325 (65 FE) MG EC tablet, TAKE 1 TABLET BY MOUTH TWICE A DAY (Patient not taking: Reported on 08/06/2022), Disp: 180 tablet, Rfl: 1   FLUoxetine (PROZAC) 20 MG capsule, Take 1 capsule (20 mg total) by mouth daily., Disp: 30 capsule, Rfl: 0   hydrOXYzine (ATARAX) 10 MG tablet, Take 1 tablet (10 mg total) by mouth 3  (three) times daily as needed for anxiety., Disp: 30 tablet, Rfl: 0   levocetirizine (XYZAL) 5 MG tablet, Take 1 tablet (5 mg total) by mouth every evening., Disp: 30 tablet, Rfl: 0   metFORMIN (GLUCOPHAGE) 500 MG tablet, Take 1 tablet (500 mg total) by mouth daily with breakfast., Disp: 30 tablet, Rfl: 1   Vitamin D, Ergocalciferol, (DRISDOL) 1.25 MG (50000 UNIT) CAPS capsule, Take 1 capsule (50,000 Units total) by mouth every 7 (seven) days. (Patient not taking: Reported on 08/06/2022), Disp: 12 capsule, Rfl: 0   Medications ordered in this encounter:  Meds ordered this encounter  Medications   fluconazole (DIFLUCAN) 150 MG tablet    Sig: Take 1 tablet PO once. Repeat in 3 days if needed.    Dispense:  2 tablet    Refill:  0    Order Specific Question:   Supervising Provider    Answer:   Merrilee Jansky [id number]     *If you need refills on other medications prior to your next appointment, please contact your pharmacy*  Follow-Up: Call back or seek an in-person evaluation if the symptoms worsen or if the condition fails to improve as anticipated.  [facility] [phone]  Other Instructions Vaginal Yeast Infection, Adult  Vaginal yeast infection is a condition that causes vaginal discharge as well as soreness, swelling, and redness (inflammation) of the vagina. This is a common condition. Some women get  this infection frequently. What are the causes? This condition is caused by a change in the normal balance of the yeast (Candida) and normal bacteria that live in the vagina. This change causes an overgrowth of yeast, which causes the inflammation. What increases the risk? The condition is more likely to develop in women who: Take antibiotic medicines. Have diabetes. Take birth control pills. Are pregnant. Douche often. Have a weak body defense system (immune system). Have been taking steroid medicines for a long time. Frequently wear tight clothing. What are  the signs or symptoms? Symptoms of this condition include: White, thick, creamy vaginal discharge. Swelling, itching, redness, and irritation of the vagina. The lips of the vagina (labia) may be affected as well. Pain or a burning feeling while urinating. Pain during sex. How is this diagnosed? This condition is diagnosed based on: Your medical history. A physical exam. A pelvic exam. Your health care provider will examine a sample of your vaginal discharge under a microscope. Your health care provider may send this sample for testing to confirm the diagnosis. How is this treated? This condition is treated with medicine. Medicines may be over-the-counter or prescription. You may be told to use one or more of the following: Medicine that is taken by mouth (orally). Medicine that is applied as a cream (topically). Medicine that is inserted directly into the vagina (suppository). Follow these instructions at home: Take or apply over-the-counter and prescription medicines only as told by your health care provider. Do not use tampons until your health care provider approves. Do not have sex until your infection has cleared. Sex can prolong or worsen your symptoms of infection. Ask your health care provider when it is safe to resume sexual activity. Keep all follow-up visits. This is important. How is this prevented?  Do not wear tight clothes, such as pantyhose or tight pants. Wear breathable cotton underwear. Do not use douches, perfumed soap, creams, or powders. Wipe from front to back after using the toilet. If you have diabetes, keep your blood sugar levels under control. Ask your health care provider for other ways to prevent yeast infections. Contact a health care provider if: You have a fever. Your symptoms go away and then return. Your symptoms do not get better with treatment. Your symptoms get worse. You have new symptoms. You develop blisters in or around your vagina. You  have blood coming from your vagina and it is not your menstrual period. You develop pain in your abdomen. Summary Vaginal yeast infection is a condition that causes discharge as well as soreness, swelling, and redness (inflammation) of the vagina. This condition is treated with medicine. Medicines may be over-the-counter or prescription. Take or apply over-the-counter and prescription medicines only as told by your health care provider. Do not douche. Resume sexual activity or use of tampons as instructed by your health care provider. Contact a health care provider if your symptoms do not get better with treatment or your symptoms go away and then return. This information is not intended to replace advice given to you by your health care provider. Make sure you discuss any questions you have with your health care provider. Document Revised: 01/07/2021 Document Reviewed: 01/07/2021 Elsevier Patient Education  2024 Elsevier Inc.    If you have been instructed to have an in-person evaluation today at a local Urgent Care facility, please use the link below. It will take you to a list of all of our available [facility], including address, phone number  and hours of operation. Please do not delay care.  [facility]  If you or a family member do not have a primary care provider, use the link below to schedule a visit and establish care. When you choose a [facility] primary care physician or advanced practice provider, you gain a long-term partner in health. Find a Primary Care Provider  Learn more about [facility] in-office and virtual care options: [facility] Get Care Now

## 2023-09-11 ENCOUNTER — Telehealth: Payer: Self-pay | Admitting: Physician Assistant

## 2023-09-11 DIAGNOSIS — E611 Iron deficiency: Secondary | ICD-10-CM

## 2023-09-11 DIAGNOSIS — H6502 Acute serous otitis media, left ear: Secondary | ICD-10-CM

## 2023-09-11 MED ORDER — FERROUS SULFATE 300 (60 FE) MG/5ML PO SOLN: 300.0000 mg | Freq: Every day | ORAL | 0 refills | Status: AC

## 2023-09-11 MED ORDER — AMOXICILLIN-POT CLAVULANATE 875-125 MG PO TABS: 1.0000 | ORAL_TABLET | Freq: Two times a day (BID) | ORAL | 0 refills | Status: DC

## 2023-09-11 MED ORDER — NEOMYCIN-POLYMYXIN-HC 3.5-10000-1 OT SOLN: 3.0000 [drp] | Freq: Four times a day (QID) | OTIC | 0 refills | Status: DC

## 2023-09-11 NOTE — Progress Notes (Signed)
Virtual Visit Consent   Dorothy Whitaker, you are scheduled for a virtual visit with a [facility] provider today. Just as with appointments in the office, your consent must be obtained to participate. Your consent will be active for this visit and any virtual visit you may have with one of our providers in the next 365 days. If you have a MyChart account, a copy of this consent can be sent to you electronically.  As this is a virtual visit, video technology does not allow for your provider to perform a traditional examination. This may limit your provider's ability to fully assess your condition. If your provider identifies any concerns that need to be evaluated in person or the need to arrange testing (such as labs, EKG, etc.), we will make arrangements to do so. Although advances in technology are sophisticated, we cannot ensure that it will always work on either your end or our end. If the connection with a video visit is poor, the visit may have to be switched to a telephone visit. With either a video or telephone visit, we are not always able to ensure that we have a secure connection.  By engaging in this virtual visit, you consent to the provision of healthcare and authorize for your insurance to be billed (if applicable) for the services provided during this visit. Depending on your insurance coverage, you may receive a charge related to this service.  I need to obtain your verbal consent now. Are you willing to proceed with your visit today? [name] has provided verbal consent on 09/11/2023 for a virtual visit (video or telephone). Margaretann Loveless, PA-C  Date: 09/11/2023 10:49 AM  Virtual Visit via Video Note   I, Margaretann Loveless, connected with  Dorothy Whitaker  ([id number], Jan 15, 1996) on 09/11/23 at 10:45 AM EST by a video-enabled telemedicine application and verified that I am speaking with the correct person using two identifiers.  Location: Patient: Virtual Visit  Location Patient: Home Provider: Virtual Visit Location Provider: Home Office   I discussed the limitations of evaluation and management by telemedicine and the availability of in person appointments. The patient expressed understanding and agreed to proceed.    History of Present Illness: Dorothy Whitaker is a 27 y.o. who identifies as a female who was assigned female at birth, and is being seen today for otalgia.  HPI: Otalgia  There is pain in the left ear. This is a new problem. The current episode started in the past 7 days (2 days). The problem occurs constantly. The problem has been gradually worsening. There has been no fever. The pain is moderate. Associated symptoms include headaches, rhinorrhea and a sore throat. Pertinent negatives include no coughing, diarrhea, ear discharge, hearing loss or vomiting. Associated symptoms comments: Chills in beginning, sinus pain on left side, mild nausea, mild dizziness. She has tried acetaminophen for the symptoms. The treatment provided no relief.    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 X 7 days, Disp: 10 mL, Rfl: 0   albuterol (VENTOLIN HFA) 108 (90 Base) MCG/ACT inhaler, Inhale 2 puffs into the lungs every 6 (six) hours as needed for wheezing or shortness of breath., Disp: 8 g, Rfl: 0   azelastine (ASTELIN) 0.1 % nasal  spray, Place 1 spray into both nostrils 2 (two) times daily. Use in each nostril as directed, Disp: 30 mL, Rfl: 0   Blood Glucose Monitoring Suppl DEVI, 1 each by Does not apply route in the morning, at noon, and at bedtime. May substitute to any manufacturer covered by patient's insurance., Disp: 1 each, Rfl: 0   clonazePAM (KLONOPIN) 0.5 MG tablet, Take 1 tablet (0.5 mg total) by mouth 2 (two) times daily as needed for anxiety., Disp: 20 tablet, Rfl: 0   ferrous sulfate 300 (60 Fe) MG/5ML syrup, Take 5 mLs (300 mg total) by mouth daily., Disp: 500 mL, Rfl: 0   fluconazole (DIFLUCAN) 150 MG tablet, Take 1 tablet PO once. Repeat in 3 days if needed., Disp: 2 tablet, Rfl: 0   FLUoxetine (PROZAC) 20 MG capsule, Take 1 capsule (20 mg total) by mouth daily., Disp: 30 capsule, Rfl: 0   hydrOXYzine (ATARAX) 10 MG tablet, Take 1 tablet (10 mg total) by mouth 3 (three) times daily as needed for anxiety., Disp: 30 tablet, Rfl: 0   levocetirizine (XYZAL) 5 MG tablet, Take 1 tablet (5 mg total) by mouth every evening., Disp: 30 tablet, Rfl: 0   metFORMIN (GLUCOPHAGE) 500 MG tablet, Take 1 tablet (500 mg total) by mouth daily with breakfast., Disp: 30 tablet, Rfl: 1   Vitamin D, Ergocalciferol, (DRISDOL) 1.25 MG (50000 UNIT) CAPS capsule, Take 1 capsule (50,000 Units total) by mouth every 7 (seven) days. (Patient not taking: Reported on 08/06/2022), Disp: 12 capsule, Rfl: 0  Observations/Objective: Patient is well-developed, well-nourished in no acute distress.  Resting comfortably at home.  Head is normocephalic, atraumatic.  No labored breathing.  Speech is clear and coherent with logical content.  Patient is alert and oriented at baseline.    Assessment and Plan: 1. Non-recurrent acute serous otitis media of left ear - amoxicillin-clavulanate (AUGMENTIN) 875-125 MG tablet; Take 1 tablet by mouth 2 (two) times daily.  Dispense: 14 tablet; Refill: 0 - neomycin-polymyxin-hydrocortisone (CORTISPORIN) OTIC  solution; Place 3 drops into the left ear 4 (four) times daily. X 7 days  Dispense: 10 mL; Refill: 0  2. Iron deficiency - ferrous sulfate 300 (60 Fe) MG/5ML syrup; Take 5 mLs (300 mg total) by mouth daily.  Dispense: 500 mL; Refill: 0  - Worsening symptoms that have not responded to OTC medications.  - Will give Augmentin and Cortisporin - Continue saline nasal rinses - Could consider to add Flonase (Fluticasone) nasal spray over the counter for possible eustachian tube dysfunction - Steam and humidifier can help - Warm compress to ear - Stay well hydrated and get plenty of rest.  - Iron refilled x 90 days at patient request - Seek in person evaluation if no symptom improvement or if symptoms worsen   Follow Up Instructions: I discussed the assessment and treatment plan with the patient. The patient was provided an opportunity to ask questions and all were answered. The patient agreed with the plan and demonstrated an understanding of the instructions.  A copy of instructions were sent to the patient via MyChart unless otherwise noted below.    The patient  was advised to call back or seek an in-person evaluation if the symptoms worsen or if the condition fails to improve as anticipated.    Margaretann Loveless, PA-C

## 2023-09-11 NOTE — Patient Instructions (Signed)
Dorothy Whitaker, thank you for joining Margaretann Loveless, PA-C for today's virtual visit.  While this provider is not your primary care provider (PCP), if your PCP is located in our provider database this encounter information will be shared with them immediately following your visit.   A [facility] MyChart account gives you access to today's visit and all your visits, tests, and labs performed at [facility] " click here if you don't have a [facility] MyChart account or go to [email or web address]  Consent: (Patient) Dorothy Whitaker provided verbal consent for this virtual visit at the beginning of the encounter.  Current Medications:  Current Outpatient Medications:    amoxicillin-clavulanate (AUGMENTIN) 875-125 MG tablet, Take 1 tablet by mouth 2 (two) times daily., Disp: 14 tablet, Rfl: 0   neomycin-polymyxin-hydrocortisone (CORTISPORIN) OTIC solution, Place 3 drops into the left ear 4 (four) times daily. X 7 days, Disp: 10 mL, Rfl: 0   albuterol (VENTOLIN HFA) 108 (90 Base) MCG/ACT inhaler, Inhale 2 puffs into the lungs every 6 (six) hours as needed for wheezing or shortness of breath., Disp: 8 g, Rfl: 0   azelastine (ASTELIN) 0.1 % nasal spray, Place 1 spray into both nostrils 2 (two) times daily. Use in each nostril as directed, Disp: 30 mL, Rfl: 0   Blood Glucose Monitoring Suppl DEVI, 1 each by Does not apply route in the morning, at noon, and at bedtime. May substitute to any manufacturer covered by patient's insurance., Disp: 1 each, Rfl: 0   clonazePAM (KLONOPIN) 0.5 MG tablet, Take 1 tablet (0.5 mg total) by mouth 2 (two) times daily as needed for anxiety., Disp: 20 tablet, Rfl: 0   ferrous sulfate 300 (60 Fe) MG/5ML syrup, Take 5 mLs (300 mg total) by mouth daily., Disp: 500 mL, Rfl: 0   fluconazole (DIFLUCAN) 150 MG tablet, Take 1 tablet PO once. Repeat in 3 days if needed., Disp: 2 tablet, Rfl: 0   FLUoxetine (PROZAC) 20 MG capsule, Take 1 capsule (20 mg  total) by mouth daily., Disp: 30 capsule, Rfl: 0   hydrOXYzine (ATARAX) 10 MG tablet, Take 1 tablet (10 mg total) by mouth 3 (three) times daily as needed for anxiety., Disp: 30 tablet, Rfl: 0   levocetirizine (XYZAL) 5 MG tablet, Take 1 tablet (5 mg total) by mouth every evening., Disp: 30 tablet, Rfl: 0   metFORMIN (GLUCOPHAGE) 500 MG tablet, Take 1 tablet (500 mg total) by mouth daily with breakfast., Disp: 30 tablet, Rfl: 1   Vitamin D, Ergocalciferol, (DRISDOL) 1.25 MG (50000 UNIT) CAPS capsule, Take 1 capsule (50,000 Units total) by mouth every 7 (seven) days. (Patient not taking: Reported on 08/06/2022), Disp: 12 capsule, Rfl: 0   Medications ordered in this encounter:  Meds ordered this encounter  Medications   amoxicillin-clavulanate (AUGMENTIN) 875-125 MG tablet    Sig: Take 1 tablet by mouth 2 (two) times daily.    Dispense:  14 tablet    Refill:  0    Order Specific Question:   Supervising Provider    Answer:   Merrilee Jansky [id number]   neomycin-polymyxin-hydrocortisone (CORTISPORIN) OTIC solution    Sig: Place 3 drops into the left ear 4 (four) times daily. X 7 days    Dispense:  10 mL    Refill:  0    Order Specific Question:   Supervising Provider    Answer:   Merrilee Jansky [id number]   ferrous sulfate 300 (60 Fe) MG/5ML syrup    Sig: Take  5 mLs (300 mg total) by mouth daily.    Dispense:  500 mL    Refill:  0    Order Specific Question:   Supervising Provider    Answer:   Merrilee Jansky [[id number]]     *If you need refills on other medications prior to your next appointment, please contact your pharmacy*  Follow-Up: Call back or seek an in-person evaluation if the symptoms worsen or if the condition fails to improve as anticipated.  [facility] [phone]  Other Instructions Otitis Media, Adult  Otitis media occurs when there is inflammation and fluid in the middle ear with signs and symptoms of an acute infection. The middle ear is a  part of the ear that contains bones for hearing as well as air that helps send sounds to the brain. When infected fluid builds up in this space, it causes pressure and can lead to an ear infection. The eustachian tube connects the middle ear to the back of the nose (nasopharynx) and normally allows air into the middle ear. If the eustachian tube becomes blocked, fluid can build up and become infected. What are the causes? This condition is caused by a blockage in the eustachian tube. This can be caused by mucus or by swelling of the tube. Problems that can cause a blockage include: A cold or other upper respiratory infection. Allergies. An irritant, such as tobacco smoke. Enlarged adenoids. The adenoids are areas of soft tissue located high in the back of the throat, behind the nose and the roof of the mouth. They are part of the body's defense system (immune system). A mass in the nasopharynx. Damage to the ear caused by pressure changes (barotrauma). What increases the risk? You are more likely to develop this condition if you: Smoke or are exposed to tobacco smoke. Have an opening in the roof of your mouth (cleft palate). Have gastroesophageal reflux. Have an immune system disorder. What are the signs or symptoms? Symptoms of this condition include: Ear pain. Fever. Decreased hearing. Tiredness (lethargy). Fluid leaking from the ear, if the eardrum is ruptured or has burst. Ringing in the ear. How is this diagnosed?  This condition is diagnosed with a physical exam. During the exam, your health care provider will use an instrument called an otoscope to look in your ear and check for redness, swelling, and fluid. He or she will also ask about your symptoms. Your health care provider may also order tests, such as: A pneumatic otoscopy. This is a test to check the movement of the eardrum. It is done by squeezing a small amount of air into the ear. A tympanogram. This is a test that shows  how well the eardrum moves in response to air pressure in the ear canal. It provides a graph for your health care provider to review. How is this treated? This condition can go away on its own within 3-5 days. But if the condition is caused by a bacterial infection and does not go away on its own, or if it keeps coming back, your health care provider may: Prescribe antibiotic medicine to treat the infection. Prescribe or recommend medicines to control pain. Follow these instructions at home: Take over-the-counter and prescription medicines only as told by your health care provider. If you were prescribed an antibiotic medicine, take it as told by your health care provider. Do not stop taking the antibiotic even if you start to feel better. Keep all follow-up visits. This is  important. Contact a health care provider if: You have bleeding from your nose. There is a lump on your neck. You are not feeling better in 5 days. You feel worse instead of better. Get help right away if: You have severe pain that is not controlled with medicine. You have swelling, redness, or pain around your ear. You have stiffness in your neck. A part of your face is not moving (paralyzed). The bone behind your ear (mastoid bone) is tender when you touch it. You develop a severe headache. Summary Otitis media is redness, soreness, and swelling of the middle ear, usually resulting in pain and decreased hearing. This condition can go away on its own within 3-5 days. If the problem does not go away in 3-5 days, your health care provider may give you medicines to treat the infection. If you were prescribed an antibiotic medicine, take it as told by your health care provider. Follow all instructions that were given to you by your health care provider. This information is not intended to replace advice given to you by your health care provider. Make sure you discuss any questions you have with your health care  provider. Document Revised: 01/28/2021 Document Reviewed: 01/28/2021 Elsevier Patient Education  2024 Elsevier Inc.    If you have been instructed to have an in-person evaluation today at a local Urgent Care facility, please use the link below. It will take you to a list of all of our available [facility], including address, phone number and hours of operation. Please do not delay care.  [facility]  If you or a family member do not have a primary care provider, use the link below to schedule a visit and establish care. When you choose a [facility] primary care physician or advanced practice provider, you gain a long-term partner in health. Find a Primary Care Provider  Learn more about [facility] in-office and virtual care options: [facility] - Get Care Now

## 2023-09-17 ENCOUNTER — Telehealth: Payer: Self-pay | Admitting: Physician Assistant

## 2023-09-17 DIAGNOSIS — R3989 Other symptoms and signs involving the genitourinary system: Secondary | ICD-10-CM

## 2023-09-17 MED ORDER — NITROFURANTOIN MONOHYD MACRO 100 MG PO CAPS: 100.0000 mg | ORAL_CAPSULE | Freq: Two times a day (BID) | ORAL | 0 refills | Status: DC

## 2023-09-17 MED ORDER — PHENAZOPYRIDINE HCL 100 MG PO TABS: 100.0000 mg | ORAL_TABLET | Freq: Three times a day (TID) | ORAL | 0 refills | Status: AC | PRN

## 2023-09-17 NOTE — Patient Instructions (Signed)
Dorothy Whitaker, thank you for joining Margaretann Loveless, PA-C for today's virtual visit.  While this provider is not your primary care provider (PCP), if your PCP is located in our provider database this encounter information will be shared with them immediately following your visit.   A [facility] MyChart account gives you access to today's visit and all your visits, tests, and labs performed at [facility] " click here if you don't have a [facility] MyChart account or go to [email or web address]  Consent: (Patient) Dorothy Whitaker provided verbal consent for this virtual visit at the beginning of the encounter.  Current Medications:  Current Outpatient Medications:    nitrofurantoin, macrocrystal-monohydrate, (MACROBID) 100 MG capsule, Take 1 capsule (100 mg total) by mouth 2 (two) times daily., Disp: 10 capsule, Rfl: 0   phenazopyridine (PYRIDIUM) 100 MG tablet, Take 1 tablet (100 mg total) by mouth 3 (three) times daily as needed for pain., Disp: 10 tablet, Rfl: 0   albuterol (VENTOLIN HFA) 108 (90 Base) MCG/ACT inhaler, Inhale 2 puffs into the lungs every 6 (six) hours as needed for wheezing or shortness of breath., Disp: 8 g, Rfl: 0   amoxicillin-clavulanate (AUGMENTIN) 875-125 MG tablet, Take 1 tablet by mouth 2 (two) times daily., Disp: 14 tablet, Rfl: 0   azelastine (ASTELIN) 0.1 % nasal spray, Place 1 spray into both nostrils 2 (two) times daily. Use in each nostril as directed, Disp: 30 mL, Rfl: 0   Blood Glucose Monitoring Suppl DEVI, 1 each by Does not apply route in the morning, at noon, and at bedtime. May substitute to any manufacturer covered by patient's insurance., Disp: 1 each, Rfl: 0   clonazePAM (KLONOPIN) 0.5 MG tablet, Take 1 tablet (0.5 mg total) by mouth 2 (two) times daily as needed for anxiety., Disp: 20 tablet, Rfl: 0   ferrous sulfate 300 (60 Fe) MG/5ML syrup, Take 5 mLs (300 mg total) by mouth daily., Disp: 500 mL, Rfl: 0   fluconazole  (DIFLUCAN) 150 MG tablet, Take 1 tablet PO once. Repeat in 3 days if needed., Disp: 2 tablet, Rfl: 0   FLUoxetine (PROZAC) 20 MG capsule, Take 1 capsule (20 mg total) by mouth daily., Disp: 30 capsule, Rfl: 0   hydrOXYzine (ATARAX) 10 MG tablet, Take 1 tablet (10 mg total) by mouth 3 (three) times daily as needed for anxiety., Disp: 30 tablet, Rfl: 0   levocetirizine (XYZAL) 5 MG tablet, Take 1 tablet (5 mg total) by mouth every evening., Disp: 30 tablet, Rfl: 0   metFORMIN (GLUCOPHAGE) 500 MG tablet, Take 1 tablet (500 mg total) by mouth daily with breakfast., Disp: 30 tablet, Rfl: 1   neomycin-polymyxin-hydrocortisone (CORTISPORIN) OTIC solution, Place 3 drops into the left ear 4 (four) times daily. X 7 days, Disp: 10 mL, Rfl: 0   Vitamin D, Ergocalciferol, (DRISDOL) 1.25 MG (50000 UNIT) CAPS capsule, Take 1 capsule (50,000 Units total) by mouth every 7 (seven) days. (Patient not taking: Reported on 08/06/2022), Disp: 12 capsule, Rfl: 0   Medications ordered in this encounter:  Meds ordered this encounter  Medications   nitrofurantoin, macrocrystal-monohydrate, (MACROBID) 100 MG capsule    Sig: Take 1 capsule (100 mg total) by mouth 2 (two) times daily.    Dispense:  10 capsule    Refill:  0    Order Specific Question:   Supervising Provider    Answer:   Merrilee Jansky [id number]   phenazopyridine (PYRIDIUM) 100 MG tablet    Sig: Take 1 tablet (  100 mg total) by mouth 3 (three) times daily as needed for pain.    Dispense:  10 tablet    Refill:  0    Order Specific Question:   Supervising Provider    Answer:   Merrilee Jansky [id number]     *If you need refills on other medications prior to your next appointment, please contact your pharmacy*  Follow-Up: Call back or seek an in-person evaluation if the symptoms worsen or if the condition fails to improve as anticipated.  [facility] [phone]  Other Instructions Urinary Tract Infection, Adult  A urinary tract  infection (UTI) is an infection of any part of the urinary tract. The urinary tract includes the kidneys, ureters, bladder, and urethra. These organs make, store, and get rid of urine in the body. An upper UTI affects the ureters and kidneys. A lower UTI affects the bladder and urethra. What are the causes? Most urinary tract infections are caused by bacteria in your genital area around your urethra, where urine leaves your body. These bacteria grow and cause inflammation of your urinary tract. What increases the risk? You are more likely to develop this condition if: You have a urinary catheter that stays in place. You are not able to control when you urinate or have a bowel movement (incontinence). You are female and you: Use a spermicide or diaphragm for birth control. Have low estrogen levels. Are pregnant. You have certain genes that increase your risk. You are sexually active. You take antibiotic medicines. You have a condition that causes your flow of urine to slow down, such as: An enlarged prostate, if you are female. Blockage in your urethra. A kidney stone. A nerve condition that affects your bladder control (neurogenic bladder). Not getting enough to drink, or not urinating often. You have certain medical conditions, such as: Diabetes. A weak disease-fighting system (immunesystem). Sickle cell disease. Gout. Spinal cord injury. What are the signs or symptoms? Symptoms of this condition include: Needing to urinate right away (urgency). Frequent urination. This may include small amounts of urine each time you urinate. Pain or burning with urination. Blood in the urine. Urine that smells bad or unusual. Trouble urinating. Cloudy urine. Vaginal discharge, if you are female. Pain in the abdomen or the lower back. You may also have: Vomiting or a decreased appetite. Confusion. Irritability or tiredness. A fever or chills. Diarrhea. The first symptom in older adults may  be confusion. In some cases, they may not have any symptoms until the infection has worsened. How is this diagnosed? This condition is diagnosed based on your medical history and a physical exam. You may also have other tests, including: Urine tests. Blood tests. Tests for STIs (sexually transmitted infections). If you have had more than one UTI, a cystoscopy or imaging studies may be done to determine the cause of the infections. How is this treated? Treatment for this condition includes: Antibiotic medicine. Over-the-counter medicines to treat discomfort. Drinking enough water to stay hydrated. If you have frequent infections or have other conditions such as a kidney stone, you may need to see a health care provider who specializes in the urinary tract (urologist). In rare cases, urinary tract infections can cause sepsis. Sepsis is a life-threatening condition that occurs when the body responds to an infection. Sepsis is treated in the hospital with IV antibiotics, fluids, and other medicines. Follow these instructions at home:  Medicines Take over-the-counter and prescription medicines only as told by your health care  provider. If you were prescribed an antibiotic medicine, take it as told by your health care provider. Do not stop using the antibiotic even if you start to feel better. General instructions Make sure you: Empty your bladder often and completely. Do not hold urine for long periods of time. Empty your bladder after sex. Wipe from front to back after urinating or having a bowel movement if you are female. Use each tissue only one time when you wipe. Drink enough fluid to keep your urine pale yellow. Keep all follow-up visits. This is important. Contact a health care provider if: Your symptoms do not get better after 1-2 days. Your symptoms go away and then return. Get help right away if: You have severe pain in your back or your lower abdomen. You have a fever or  chills. You have nausea or vomiting. Summary A urinary tract infection (UTI) is an infection of any part of the urinary tract, which includes the kidneys, ureters, bladder, and urethra. Most urinary tract infections are caused by bacteria in your genital area. Treatment for this condition often includes antibiotic medicines. If you were prescribed an antibiotic medicine, take it as told by your health care provider. Do not stop using the antibiotic even if you start to feel better. Keep all follow-up visits. This is important. This information is not intended to replace advice given to you by your health care provider. Make sure you discuss any questions you have with your health care provider. Document Revised: 05/27/2020 Document Reviewed: 06/01/2020 Elsevier Patient Education  2024 Elsevier Inc.    If you have been instructed to have an in-person evaluation today at a local Urgent Care facility, please use the link below. It will take you to a list of all of our available [facility], including address, phone number and hours of operation. Please do not delay care.  [facility]  If you or a family member do not have a primary care provider, use the link below to schedule a visit and establish care. When you choose a [facility] primary care physician or advanced practice provider, you gain a long-term partner in health. Find a Primary Care Provider  Learn more about [facility] in-office and virtual care options: [facility] - Get Care Now

## 2023-09-17 NOTE — Progress Notes (Signed)
Virtual Visit Consent   Dorothy Whitaker, you are scheduled for a virtual visit with a [facility] provider today. Just as with appointments in the office, your consent must be obtained to participate. Your consent will be active for this visit and any virtual visit you may have with one of our providers in the next 365 days. If you have a MyChart account, a copy of this consent can be sent to you electronically.  As this is a virtual visit, video technology does not allow for your provider to perform a traditional examination. This may limit your provider's ability to fully assess your condition. If your provider identifies any concerns that need to be evaluated in person or the need to arrange testing (such as labs, EKG, etc.), we will make arrangements to do so. Although advances in technology are sophisticated, we cannot ensure that it will always work on either your end or our end. If the connection with a video visit is poor, the visit may have to be switched to a telephone visit. With either a video or telephone visit, we are not always able to ensure that we have a secure connection.  By engaging in this virtual visit, you consent to the provision of healthcare and authorize for your insurance to be billed (if applicable) for the services provided during this visit. Depending on your insurance coverage, you may receive a charge related to this service.  I need to obtain your verbal consent now. Are you willing to proceed with your visit today? Dorothy Whitaker has provided verbal consent on 09/17/2023 for a virtual visit (video or telephone). Margaretann Loveless, PA-C  Date: 09/17/2023 1:37 PM  Virtual Visit via Video Note   I, Margaretann Loveless, connected with  Dorothy Whitaker  ([id number], 05-15-1996) on 09/17/23 at  1:30 PM EST by a video-enabled telemedicine application and verified that I am speaking with the correct person using two identifiers.  Location: Patient: Virtual Visit  Location Patient: Home Provider: Virtual Visit Location Provider: Home Office   I discussed the limitations of evaluation and management by telemedicine and the availability of in person appointments. The patient expressed understanding and agreed to proceed.    History of Present Illness: Dorothy Whitaker is a 27 y.o. who identifies as a female who was assigned female at birth, and is being seen today for UTI.  HPI: Urinary Tract Infection  This is a new problem. The current episode started 1 to 4 weeks ago. The problem occurs intermittently. The problem has been gradually worsening. The quality of the pain is described as aching and burning. There has been no fever. Associated symptoms include chills (last week), frequency, hesitancy, nausea and urgency. Pertinent negatives include no flank pain, hematuria or sweats. Associated symptoms comments: Cloudy urine, suprapubic pain with urination. She has tried antibiotics, acetaminophen and increased fluids (augmentin for ear infection, just completed) for the symptoms. The treatment provided no relief.   Seen Nov 8th and started on Augmentin and Cortisporin ear drops for an acute otitis media.  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   Panic attack 06/26/2021   Symptomatic anemia 11/15/2019   Heavy menstrual bleeding 11/15/2019   Asthma    Acanthosis nigricans 09/21/2015   Body mass index (BMI) of 36.0-36.9 in adult 07/17/2015    Allergies: No Known Allergies Medications:  Current Outpatient Medications:    nitrofurantoin,  macrocrystal-monohydrate, (MACROBID) 100 MG capsule, Take 1 capsule (100 mg total) by mouth 2 (two) times daily., Disp: 10 capsule, Rfl: 0   phenazopyridine (PYRIDIUM) 100 MG tablet, Take 1 tablet (100 mg total) by mouth 3 (three) times daily as needed for pain., Disp: 10 tablet, Rfl: 0   albuterol (VENTOLIN HFA) 108 (90  Base) MCG/ACT inhaler, Inhale 2 puffs into the lungs every 6 (six) hours as needed for wheezing or shortness of breath., Disp: 8 g, Rfl: 0   amoxicillin-clavulanate (AUGMENTIN) 875-125 MG tablet, Take 1 tablet by mouth 2 (two) times daily., Disp: 14 tablet, Rfl: 0   azelastine (ASTELIN) 0.1 % nasal spray, Place 1 spray into both nostrils 2 (two) times daily. Use in each nostril as directed, Disp: 30 mL, Rfl: 0   Blood Glucose Monitoring Suppl DEVI, 1 each by Does not apply route in the morning, at noon, and at bedtime. May substitute to any manufacturer covered by patient's insurance., Disp: 1 each, Rfl: 0   clonazePAM (KLONOPIN) 0.5 MG tablet, Take 1 tablet (0.5 mg total) by mouth 2 (two) times daily as needed for anxiety., Disp: 20 tablet, Rfl: 0   ferrous sulfate 300 (60 Fe) MG/5ML syrup, Take 5 mLs (300 mg total) by mouth daily., Disp: 500 mL, Rfl: 0   fluconazole (DIFLUCAN) 150 MG tablet, Take 1 tablet PO once. Repeat in 3 days if needed., Disp: 2 tablet, Rfl: 0   FLUoxetine (PROZAC) 20 MG capsule, Take 1 capsule (20 mg total) by mouth daily., Disp: 30 capsule, Rfl: 0   hydrOXYzine (ATARAX) 10 MG tablet, Take 1 tablet (10 mg total) by mouth 3 (three) times daily as needed for anxiety., Disp: 30 tablet, Rfl: 0   levocetirizine (XYZAL) 5 MG tablet, Take 1 tablet (5 mg total) by mouth every evening., Disp: 30 tablet, Rfl: 0   metFORMIN (GLUCOPHAGE) 500 MG tablet, Take 1 tablet (500 mg total) by mouth daily with breakfast., Disp: 30 tablet, Rfl: 1   neomycin-polymyxin-hydrocortisone (CORTISPORIN) OTIC solution, Place 3 drops into the left ear 4 (four) times daily. X 7 days, Disp: 10 mL, Rfl: 0   Vitamin D, Ergocalciferol, (DRISDOL) 1.25 MG (50000 UNIT) CAPS capsule, Take 1 capsule (50,000 Units total) by mouth every 7 (seven) days. (Patient not taking: Reported on 08/06/2022), Disp: 12 capsule, Rfl: 0  Observations/Objective: Patient is well-developed, well-nourished in no acute distress.  Resting  comfortably at home.  Head is normocephalic, atraumatic.  No labored breathing.  Speech is clear and coherent with logical content.  Patient is alert and oriented at baseline.    Assessment and Plan: 1. Suspected UTI - nitrofurantoin, macrocrystal-monohydrate, (MACROBID) 100 MG capsule; Take 1 capsule (100 mg total) by mouth 2 (two) times daily.  Dispense: 10 capsule; Refill: 0 - phenazopyridine (PYRIDIUM) 100 MG tablet; Take 1 tablet (100 mg total) by mouth 3 (three) times daily as needed for pain.  Dispense: 10 tablet; Refill: 0  - Worsening symptoms.  - Will treat empirically with Macrobid - May use Pyridium for bladder spasms - Continue to push fluids.  - Seek in person evaluation for urine culture if symptoms do not improve or if they worsen.    Follow Up Instructions: I discussed the assessment and treatment plan with the patient. The patient was provided an opportunity to ask questions and all were answered. The patient agreed with the plan and demonstrated an understanding of the instructions.  A copy of instructions were sent to the patient via MyChart unless otherwise noted  below.    The patient was advised to call back or seek an in-person evaluation if the symptoms worsen or if the condition fails to improve as anticipated.    Margaretann Loveless, PA-C

## 2023-09-17 NOTE — Progress Notes (Signed)
The patient no-showed for appointment despite this provider sending direct link with no response and waiting for at least 10 minutes from appointment time for patient to join. They will be marked as a NS for this appointment/time.   Maryon Kemnitz M Kenzie Flakes, PA-C    

## 2023-09-19 ENCOUNTER — Emergency Department (HOSPITAL_BASED_OUTPATIENT_CLINIC_OR_DEPARTMENT_OTHER)
Admission: EM | Admit: 2023-09-19 | Discharge: 2023-09-19 | Disposition: A | Discharge status: Home / Self Care | Payer: Self-pay | Attending: Emergency Medicine | Admitting: Emergency Medicine

## 2023-09-19 ENCOUNTER — Encounter (HOSPITAL_BASED_OUTPATIENT_CLINIC_OR_DEPARTMENT_OTHER): Payer: Self-pay

## 2023-09-19 ENCOUNTER — Encounter: Payer: Self-pay | Admitting: Nurse Practitioner

## 2023-09-19 ENCOUNTER — Telehealth: Payer: Self-pay | Admitting: Nurse Practitioner

## 2023-09-19 DIAGNOSIS — J45909 Unspecified asthma, uncomplicated: Secondary | ICD-10-CM | POA: Insufficient documentation

## 2023-09-19 DIAGNOSIS — B3731 Acute candidiasis of vulva and vagina: Secondary | ICD-10-CM | POA: Insufficient documentation

## 2023-09-19 DIAGNOSIS — Z7984 Long term (current) use of oral hypoglycemic drugs: Secondary | ICD-10-CM | POA: Insufficient documentation

## 2023-09-19 DIAGNOSIS — B9689 Other specified bacterial agents as the cause of diseases classified elsewhere: Secondary | ICD-10-CM | POA: Insufficient documentation

## 2023-09-19 DIAGNOSIS — N3000 Acute cystitis without hematuria: Secondary | ICD-10-CM | POA: Insufficient documentation

## 2023-09-19 DIAGNOSIS — E119 Type 2 diabetes mellitus without complications: Secondary | ICD-10-CM | POA: Insufficient documentation

## 2023-09-19 DIAGNOSIS — B379 Candidiasis, unspecified: Secondary | ICD-10-CM

## 2023-09-19 DIAGNOSIS — N760 Acute vaginitis: Secondary | ICD-10-CM | POA: Insufficient documentation

## 2023-09-19 LAB — WET PREP, GENITAL
Sperm: NONE SEEN
Trich, Wet Prep: NONE SEEN
WBC, Wet Prep HPF POC: >=10 — AB (ref <10)

## 2023-09-19 LAB — URINALYSIS, ROUTINE W REFLEX MICROSCOPIC
Bilirubin Urine: NEGATIVE
Glucose, UA: >1000 mg/dL — AB
Hgb urine dipstick: NEGATIVE
Ketones, ur: 40 mg/dL — AB
Nitrite: NEGATIVE
Protein, ur: 30 mg/dL — AB
Specific Gravity, Urine: 1.039 — ABNORMAL HIGH (ref 1.005–1.030)
pH: 6 (ref 5.0–8.0)

## 2023-09-19 LAB — BASIC METABOLIC PANEL
Anion gap: 11 (ref 5–15)
BUN: 11 mg/dL (ref 6–20)
CO2: 20 mmol/L — ABNORMAL LOW (ref 22–32)
Calcium: 9.9 mg/dL (ref 8.9–10.3)
Chloride: 101 mmol/L (ref 98–111)
Creatinine, Ser: 0.69 mg/dL (ref 0.44–1.00)
GFR, Estimated: >60 mL/min (ref >60)
Glucose, Bld: 299 mg/dL — ABNORMAL HIGH (ref 70–99)
Potassium: 3.7 mmol/L (ref 3.5–5.1)
Sodium: 132 mmol/L — ABNORMAL LOW (ref 135–145)

## 2023-09-19 LAB — CBC
HCT: 42.3 % (ref 36.0–46.0)
Hemoglobin: 13.8 g/dL (ref 12.0–15.0)
MCH: 22.7 pg — ABNORMAL LOW (ref 26.0–34.0)
MCHC: 32.6 g/dL (ref 30.0–36.0)
MCV: 69.7 fL — ABNORMAL LOW (ref 80.0–100.0)
Platelets: 259 10*3/uL (ref 150–400)
RBC: 6.07 MIL/uL — ABNORMAL HIGH (ref 3.87–5.11)
RDW: 15.2 % (ref 11.5–15.5)
WBC: 6.3 10*3/uL (ref 4.0–10.5)
nRBC: 0 % (ref 0.0–0.2)

## 2023-09-19 LAB — CBG MONITORING, ED: Glucose-Capillary: 269 mg/dL — ABNORMAL HIGH (ref 70–99)

## 2023-09-19 LAB — HIV ANTIBODY (ROUTINE TESTING W REFLEX): HIV Screen 4th Generation wRfx: NONREACTIVE

## 2023-09-19 LAB — PREGNANCY, URINE: Preg Test, Ur: NEGATIVE

## 2023-09-19 MED ORDER — CEPHALEXIN 500 MG PO CAPS: 500.0000 mg | ORAL_CAPSULE | Freq: Four times a day (QID) | ORAL | 0 refills | Status: AC

## 2023-09-19 MED ORDER — SULFAMETHOXAZOLE-TRIMETHOPRIM 800-160 MG PO TABS: 1.0000 | ORAL_TABLET | Freq: Two times a day (BID) | ORAL | 0 refills | Status: DC

## 2023-09-19 MED ORDER — METRONIDAZOLE 500 MG PO TABS
500.0000 mg | ORAL_TABLET | Freq: Two times a day (BID) | ORAL | 0 refills | Status: AC
Start: 2023-09-19 — End: 2023-09-26

## 2023-09-19 MED ORDER — METRONIDAZOLE 500 MG PO TABS
500.0000 mg | ORAL_TABLET | Freq: Once | ORAL | Status: AC
  Administered 2023-09-19: 500 mg via ORAL
  Filled 2023-09-19: qty 1

## 2023-09-19 MED ORDER — MICONAZOLE NITRATE 200 MG VA SUPP: 200.0000 mg | Freq: Every day | VAGINAL | Status: DC

## 2023-09-19 MED ORDER — FLUCONAZOLE 150 MG PO TABS: ORAL_TABLET | ORAL | 0 refills | Status: DC

## 2023-09-19 MED ORDER — CEPHALEXIN 250 MG PO CAPS
500.0000 mg | ORAL_CAPSULE | Freq: Once | ORAL | Status: AC
  Administered 2023-09-19: 500 mg via ORAL
  Filled 2023-09-19: qty 2

## 2023-09-19 MED ORDER — FLUCONAZOLE 150 MG PO TABS
150.0000 mg | ORAL_TABLET | Freq: Once | ORAL | Status: AC
  Administered 2023-09-19: 150 mg via ORAL
  Filled 2023-09-19: qty 1

## 2023-09-19 NOTE — Discharge Instructions (Addendum)
You have a yeast infection, bacterial vaginosis, and a urinary tract infection.  Take Flagyl 2x a day for BV take Keflex 500 mg 4 times a day for the next 7 days for UTI.  Follow-up with [facility] for reevaluation of your diabetes.  Return to the ED if your symptoms worsen in the interim.

## 2023-09-19 NOTE — Progress Notes (Signed)
Virtual Visit Consent   Dorothy Whitaker, you are scheduled for a virtual visit with a [facility] provider today. Just as with appointments in the office, your consent must be obtained to participate. Your consent will be active for this visit and any virtual visit you may have with one of our providers in the next 365 days. If you have a MyChart account, a copy of this consent can be sent to you electronically.  As this is a virtual visit, video technology does not allow for your provider to perform a traditional examination. This may limit your provider's ability to fully assess your condition. If your provider identifies any concerns that need to be evaluated in person or the need to arrange testing (such as labs, EKG, etc.), we will make arrangements to do so. Although advances in technology are sophisticated, we cannot ensure that it will always work on either your end or our end. If the connection with a video visit is poor, the visit may have to be switched to a telephone visit. With either a video or telephone visit, we are not always able to ensure that we have a secure connection.  By engaging in this virtual visit, you consent to the provision of healthcare and authorize for your insurance to be billed (if applicable) for the services provided during this visit. Depending on your insurance coverage, you may receive a charge related to this service.  I need to obtain your verbal consent now. Are you willing to proceed with your visit today? CLO ALDI has provided verbal consent on 09/19/2023 for a virtual visit (video or telephone). Claiborne Rigg, NP  Date: 09/19/2023 1:32 PM  Virtual Visit via Video Note   I, Claiborne Rigg, connected with  Dorothy Whitaker  ([id number], 07/09/1996) on 09/19/23 at  1:15 PM EST by a video-enabled telemedicine application and verified that I am speaking with the correct person using two identifiers.  Location: Patient: Virtual Visit Location  Patient: Home Provider: Virtual Visit Location Provider: Home Office   I discussed the limitations of evaluation and management by telemedicine and the availability of in person appointments. The patient expressed understanding and agreed to proceed.    History of Present Illness: Dorothy Whitaker is a 27 y.o. who identifies as a female who was assigned female at birth, and is being seen today for vaginal itching and irritation.  Ms. Petrides is currently taking antibiotics for UTI and notes vaginal itching and irritation. Likely antibiotic induced vaginitis.    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   Panic attack 06/26/2021   Symptomatic anemia 11/15/2019   Heavy menstrual bleeding 11/15/2019   Asthma    Acanthosis nigricans 09/21/2015   Body mass index (BMI) of 36.0-36.9 in adult 07/17/2015    Allergies: No Known Allergies Medications:  Current Outpatient Medications:    albuterol (VENTOLIN HFA) 108 (90 Base) MCG/ACT inhaler, Inhale 2 puffs into the lungs every 6 (six) hours as needed for wheezing or shortness of breath., Disp: 8 g, Rfl: 0   amoxicillin-clavulanate (AUGMENTIN) 875-125 MG tablet, Take 1 tablet by mouth 2 (two) times daily., Disp: 14 tablet, Rfl: 0   azelastine (ASTELIN) 0.1 % nasal spray, Place 1 spray into both nostrils 2 (two) times daily. Use in each nostril as directed, Disp: 30 mL, Rfl: 0   Blood Glucose Monitoring Suppl DEVI, 1 each by Does  not apply route in the morning, at noon, and at bedtime. May substitute to any manufacturer covered by patient's insurance., Disp: 1 each, Rfl: 0   clonazePAM (KLONOPIN) 0.5 MG tablet, Take 1 tablet (0.5 mg total) by mouth 2 (two) times daily as needed for anxiety., Disp: 20 tablet, Rfl: 0   ferrous sulfate 300 (60 Fe) MG/5ML syrup, Take 5 mLs (300 mg total) by mouth daily., Disp: 500 mL, Rfl: 0   fluconazole (DIFLUCAN) 150  MG tablet, Take 1 tablet PO once. Repeat in 3 days if needed., Disp: 2 tablet, Rfl: 0   FLUoxetine (PROZAC) 20 MG capsule, Take 1 capsule (20 mg total) by mouth daily., Disp: 30 capsule, Rfl: 0   hydrOXYzine (ATARAX) 10 MG tablet, Take 1 tablet (10 mg total) by mouth 3 (three) times daily as needed for anxiety., Disp: 30 tablet, Rfl: 0   levocetirizine (XYZAL) 5 MG tablet, Take 1 tablet (5 mg total) by mouth every evening., Disp: 30 tablet, Rfl: 0   metFORMIN (GLUCOPHAGE) 500 MG tablet, Take 1 tablet (500 mg total) by mouth daily with breakfast., Disp: 30 tablet, Rfl: 1   neomycin-polymyxin-hydrocortisone (CORTISPORIN) OTIC solution, Place 3 drops into the left ear 4 (four) times daily. X 7 days, Disp: 10 mL, Rfl: 0   nitrofurantoin, macrocrystal-monohydrate, (MACROBID) 100 MG capsule, Take 1 capsule (100 mg total) by mouth 2 (two) times daily., Disp: 10 capsule, Rfl: 0   phenazopyridine (PYRIDIUM) 100 MG tablet, Take 1 tablet (100 mg total) by mouth 3 (three) times daily as needed for pain., Disp: 10 tablet, Rfl: 0   Vitamin D, Ergocalciferol, (DRISDOL) 1.25 MG (50000 UNIT) CAPS capsule, Take 1 capsule (50,000 Units total) by mouth every 7 (seven) days. (Patient not taking: Reported on 08/06/2022), Disp: 12 capsule, Rfl: 0  Observations/Objective: Patient is well-developed, well-nourished in no acute distress.  Resting comfortably at home.  Head is normocephalic, atraumatic.  No labored breathing.  Speech is clear and coherent with logical content.  Patient is alert and oriented at baseline.    Assessment and Plan: 1. Yeast vaginitis - fluconazole (DIFLUCAN) 150 MG tablet; Take 1 tablet PO once. Repeat in 3 days if needed.  Dispense: 2 tablet; Refill: 0  Needs to make appointment at urgent care or can go to health department for physical exam and labwork for possible diabetes due to frequent yeast vaginitis infections.   Follow Up Instructions: I discussed the assessment and treatment plan  with the patient. The patient was provided an opportunity to ask questions and all were answered. The patient agreed with the plan and demonstrated an understanding of the instructions.  A copy of instructions were sent to the patient via MyChart unless otherwise noted below.     The patient was advised to call back or seek an in-person evaluation if the symptoms worsen or if the condition fails to improve as anticipated.    Claiborne Rigg, NP

## 2023-09-19 NOTE — ED Provider Notes (Signed)
[place] EMERGENCY DEPARTMENT AT [facility] Provider Note   CSN: [id number] Arrival date & time: 09/19/23  1637     History  No chief complaint on file.   Dorothy Whitaker is a 27 y.o. female with a history of asthma, anemia, and diabetes mellitus presents the ED today for vaginal itching.  Patient reports intermittent vaginal itching for the past several months.  She had a televisit appoint with her primary care  2 days ago for vaginal aching and burning sensation.  They started her empirically on Macrobid for UTI.  Patient states that her symptoms have persisted since gotten better with the medication.  She endorses vaginal irritation and soreness from scratching for the past week.  No fevers, nausea, vomiting, abdominal or flank pain.  Denies vaginal bleeding or discharge.  She is sexually active with 1 partner and they do not use condoms.  She states that she usually gets the symptoms after intercourse.  No other complaints or concerns at this time.    Home Medications Prior to Admission medications   Medication Sig Start Date End Date Taking? Authorizing Provider  cephALEXin (KEFLEX) 500 MG capsule Take 1 capsule (500 mg total) by mouth 4 (four) times daily for 7 days. 09/19/23 09/26/23 Yes Maxwell Marion, PA-C  metroNIDAZOLE (FLAGYL) 500 MG tablet Take 1 tablet (500 mg total) by mouth 2 (two) times daily for 7 days. 09/19/23 09/26/23 Yes Maxwell Marion, PA-C  albuterol (VENTOLIN HFA) 108 (90 Base) MCG/ACT inhaler Inhale 2 puffs into the lungs every 6 (six) hours as needed for wheezing or shortness of breath. 12/02/22   Viviano Simas, FNP  azelastine (ASTELIN) 0.1 % nasal spray Place 1 spray into both nostrils 2 (two) times daily. Use in each nostril as directed 08/05/23   Waldon Merl, PA-C  Blood Glucose Monitoring Suppl DEVI 1 each by Does not apply route in the morning, at noon, and at bedtime. May substitute to any manufacturer covered by patient's insurance. 12/21/22    Achille Rich, PA-C  clonazePAM (KLONOPIN) 0.5 MG tablet Take 1 tablet (0.5 mg total) by mouth 2 (two) times daily as needed for anxiety. 08/06/22   Shade Flood, MD  ferrous sulfate 300 (60 Fe) MG/5ML syrup Take 5 mLs (300 mg total) by mouth daily. 09/11/23   Margaretann Loveless, PA-C  fluconazole (DIFLUCAN) 150 MG tablet Take 1 tablet PO once. Repeat in 3 days if needed. 09/19/23   Claiborne Rigg, NP  FLUoxetine (PROZAC) 20 MG capsule Take 1 capsule (20 mg total) by mouth daily. 01/16/23   Waldon Merl, PA-C  hydrOXYzine (ATARAX) 10 MG tablet Take 1 tablet (10 mg total) by mouth 3 (three) times daily as needed for anxiety. 01/16/23   Waldon Merl, PA-C  levocetirizine (XYZAL) 5 MG tablet Take 1 tablet (5 mg total) by mouth every evening. 08/05/23   Waldon Merl, PA-C  metFORMIN (GLUCOPHAGE) 500 MG tablet Take 1 tablet (500 mg total) by mouth daily with breakfast. 12/21/22   Achille Rich, PA-C  neomycin-polymyxin-hydrocortisone (CORTISPORIN) OTIC solution Place 3 drops into the left ear 4 (four) times daily. X 7 days 09/11/23   Margaretann Loveless, PA-C  phenazopyridine (PYRIDIUM) 100 MG tablet Take 1 tablet (100 mg total) by mouth 3 (three) times daily as needed for pain. 09/17/23   Margaretann Loveless, PA-C  Vitamin D, Ergocalciferol, (DRISDOL) 1.25 MG (50000 UNIT) CAPS capsule Take 1 capsule (50,000 Units total) by mouth every 7 (seven) days. Patient  not taking: Reported on 08/06/2022 07/04/21   [name], NP      Allergies    Patient has no known allergies.    Review of Systems   Review of Systems  Genitourinary:        Vaginal itching  All other systems reviewed and are negative.   Physical Exam Updated Vital Signs BP 138/88 (BP Location: Right Arm)   Pulse 98   Temp 97.6 F (36.4 C) (Oral)   Resp 18   LMP 08/25/2023 (Exact Date)   SpO2 98%  Physical Exam Vitals and nursing note reviewed.  Constitutional:      General: She is not in acute distress.     Appearance: Normal appearance.  HENT:     Head: Normocephalic and atraumatic.     Mouth/Throat:     Mouth: Mucous membranes are moist.  Eyes:     Conjunctiva/sclera: Conjunctivae normal.     Pupils: Pupils are equal, round, and reactive to light.  Cardiovascular:     Rate and Rhythm: Normal rate and regular rhythm.     Pulses: Normal pulses.     Heart sounds: Normal heart sounds.  Pulmonary:     Effort: Pulmonary effort is normal.     Breath sounds: Normal breath sounds.  Abdominal:     Palpations: Abdomen is soft.     Tenderness: There is no abdominal tenderness.  Skin:    General: Skin is warm and dry.     Findings: No rash.  Neurological:     General: No focal deficit present.     Mental Status: She is alert.  Psychiatric:        Mood and Affect: Mood normal.        Behavior: Behavior normal.    ED Results / Procedures / Treatments   Labs (all labs ordered are listed, but only abnormal results are displayed) Labs Reviewed  WET PREP, GENITAL - Abnormal; Notable for the following components:      Result Value   Yeast Wet Prep HPF POC PRESENT (*)    Clue Cells Wet Prep HPF POC PRESENT (*)    WBC, Wet Prep HPF POC >=10 (*)    All other components within normal limits  URINALYSIS, ROUTINE W REFLEX MICROSCOPIC - Abnormal; Notable for the following components:   Specific Gravity, Urine 1.039 (*)    Glucose, UA >1,000 (*)    Ketones, ur 40 (*)    Protein, ur 30 (*)    Leukocytes,Ua MODERATE (*)    Bacteria, UA RARE (*)    All other components within normal limits  BASIC METABOLIC PANEL - Abnormal; Notable for the following components:   Sodium 132 (*)    CO2 20 (*)    Glucose, Bld 299 (*)    All other components within normal limits  CBC - Abnormal; Notable for the following components:   RBC 6.07 (*)    MCV 69.7 (*)    MCH 22.7 (*)    All other components within normal limits  CBG MONITORING, ED - Abnormal; Notable for the following components:    Glucose-Capillary 269 (*)    All other components within normal limits  URINE CULTURE  PREGNANCY, URINE  HIV ANTIBODY (ROUTINE TESTING W REFLEX)  RPR  GC/CHLAMYDIA PROBE AMP ([place]) NOT AT [facility]    EKG None  Radiology No results found.  Procedures Procedures: not indicated.   Medications Ordered in ED Medications  fluconazole (DIFLUCAN) tablet 150 mg (150 mg Oral  Given 09/19/23 1949)  metroNIDAZOLE (FLAGYL) tablet 500 mg (500 mg Oral Given 09/19/23 1949)  cephALEXin (KEFLEX) capsule 500 mg (500 mg Oral Given 09/19/23 2137)    ED Course/ Medical Decision Making/ A&P Clinical Course as of 09/19/23 2317  Sat Sep 19, 2023  1948 Yeast Wet Prep HPF POC(!): PRESENT [JL]  1948 Clue Cells Wet Prep HPF POC(!): PRESENT [JL]  1948 WBC, Wet Prep HPF POC(!): >=10 [JL]    Clinical Course User Index [JL] Ernie Avena, MD                                 Medical Decision Making Amount and/or Complexity of Data Reviewed Labs: ordered. Decision-making details documented in ED Course.  Risk Prescription drug management.   This patient presents to the ED for concern of vaginal itching, this involves an extensive number of treatment options, and is a complaint that carries with it a high risk of complications and morbidity.   Differential diagnosis includes: yeast infection, BV, STI, pregnancy, UTI, pyelonephritis, etc.   Comorbidities  See HPI above   Additional History  Additional history obtained from prior family medicine notes.   Lab Tests  I ordered and personally interpreted labs.  The pertinent results include:   Wet prep positive for BV, yeast infection Gonorrhea and chlamydia labs pending at discharge RPR and HIV testing pending at discharge UA shows moderate leukocytes, rare bacteria, >1,000 glucose.  Urine culture pending. Elevated glucose of 299 on BMP Negative pregnancy    Problem List / ED Course / Critical Interventions / Medication  Management  Vaginal itching I ordered medications including: Diflucan for yeast infection Flagyl for BV Keflex for UTI  Reevaluation of the patient after these medicines showed that the patient improved I have reviewed the patients home medicines and have made adjustments as needed   Social Determinants of Health  Social connection   Test / Admission - Considered  Discussed findings with patient.  She is stable and safe for discharge home. Return precautions given.        Final Clinical Impression(s) / ED Diagnoses Final diagnoses:  Bacterial vaginosis  Yeast infection  Acute cystitis without hematuria    Rx / DC Orders ED Discharge Orders          Ordered    metroNIDAZOLE (FLAGYL) 500 MG tablet  2 times daily        09/19/23 2118    sulfamethoxazole-trimethoprim (BACTRIM DS) 800-160 MG tablet  2 times daily,   Status:  Discontinued        09/19/23 2122    cephALEXin (KEFLEX) 500 MG capsule  4 times daily        09/19/23 2123              Maxwell Marion, PA-C 09/19/23 2318    Ernie Avena, MD 09/20/23 [phone]

## 2023-09-19 NOTE — ED Triage Notes (Signed)
She c/o vaginal soreness and irritation without discharge, fever, nor abd. Pain.

## 2023-09-19 NOTE — Progress Notes (Signed)
Left without being seen.

## 2023-09-19 NOTE — Patient Instructions (Signed)
[name], thank you for joining Claiborne Rigg, NP for today's virtual visit.  While this provider is not your primary care provider (PCP), if your PCP is located in our provider database this encounter information will be shared with them immediately following your visit.   A [facility] MyChart account gives you access to today's visit and all your visits, tests, and labs performed at [facility] " click here if you don't have a [facility] MyChart account or go to [email or web address]  Consent: (Patient) [name] provided verbal consent for this virtual visit at the beginning of the encounter.  Current Medications:  Current Outpatient Medications:    albuterol (VENTOLIN HFA) 108 (90 Base) MCG/ACT inhaler, Inhale 2 puffs into the lungs every 6 (six) hours as needed for wheezing or shortness of breath., Disp: 8 g, Rfl: 0   amoxicillin-clavulanate (AUGMENTIN) 875-125 MG tablet, Take 1 tablet by mouth 2 (two) times daily., Disp: 14 tablet, Rfl: 0   azelastine (ASTELIN) 0.1 % nasal spray, Place 1 spray into both nostrils 2 (two) times daily. Use in each nostril as directed, Disp: 30 mL, Rfl: 0   Blood Glucose Monitoring Suppl DEVI, 1 each by Does not apply route in the morning, at noon, and at bedtime. May substitute to any manufacturer covered by patient's insurance., Disp: 1 each, Rfl: 0   clonazePAM (KLONOPIN) 0.5 MG tablet, Take 1 tablet (0.5 mg total) by mouth 2 (two) times daily as needed for anxiety., Disp: 20 tablet, Rfl: 0   ferrous sulfate 300 (60 Fe) MG/5ML syrup, Take 5 mLs (300 mg total) by mouth daily., Disp: 500 mL, Rfl: 0   fluconazole (DIFLUCAN) 150 MG tablet, Take 1 tablet PO once. Repeat in 3 days if needed., Disp: 2 tablet, Rfl: 0   FLUoxetine (PROZAC) 20 MG capsule, Take 1 capsule (20 mg total) by mouth daily., Disp: 30 capsule, Rfl: 0   hydrOXYzine (ATARAX) 10 MG tablet, Take 1 tablet (10 mg total) by mouth 3 (three) times daily as needed  for anxiety., Disp: 30 tablet, Rfl: 0   levocetirizine (XYZAL) 5 MG tablet, Take 1 tablet (5 mg total) by mouth every evening., Disp: 30 tablet, Rfl: 0   metFORMIN (GLUCOPHAGE) 500 MG tablet, Take 1 tablet (500 mg total) by mouth daily with breakfast., Disp: 30 tablet, Rfl: 1   neomycin-polymyxin-hydrocortisone (CORTISPORIN) OTIC solution, Place 3 drops into the left ear 4 (four) times daily. X 7 days, Disp: 10 mL, Rfl: 0   nitrofurantoin, macrocrystal-monohydrate, (MACROBID) 100 MG capsule, Take 1 capsule (100 mg total) by mouth 2 (two) times daily., Disp: 10 capsule, Rfl: 0   phenazopyridine (PYRIDIUM) 100 MG tablet, Take 1 tablet (100 mg total) by mouth 3 (three) times daily as needed for pain., Disp: 10 tablet, Rfl: 0   Vitamin D, Ergocalciferol, (DRISDOL) 1.25 MG (50000 UNIT) CAPS capsule, Take 1 capsule (50,000 Units total) by mouth every 7 (seven) days. (Patient not taking: Reported on 08/06/2022), Disp: 12 capsule, Rfl: 0   Medications ordered in this encounter:  Meds ordered this encounter  Medications   fluconazole (DIFLUCAN) 150 MG tablet    Sig: Take 1 tablet PO once. Repeat in 3 days if needed.    Dispense:  2 tablet    Refill:  0    Order Specific Question:   Supervising Provider    Answer:   Merrilee Jansky [id number]     *If you need refills on other medications prior to your next appointment,  please contact your pharmacy*  Follow-Up: Call back or seek an in-person evaluation if the symptoms worsen or if the condition fails to improve as anticipated.  [facility] [phone]  Other Instructions Needs to make appointment at urgent care or can go to health department for physical exam and labwork for possible diabetes due to frequent yeast vaginitis infections.    If you have been instructed to have an in-person evaluation today at a local Urgent Care facility, please use the link below. It will take you to a list of all of our available [facility], including address, phone number and hours of operation. Please do not delay care.  [facility]  If you or a family member do not have a primary care provider, use the link below to schedule a visit and establish care. When you choose a [facility] primary care physician or advanced practice provider, you gain a long-term partner in health. Find a Primary Care Provider  Learn more about [facility]'s in-office and virtual care options: [facility] - Get Care Now

## 2023-09-20 LAB — RPR: RPR Ser Ql: NONREACTIVE

## 2023-09-20 LAB — URINE CULTURE: Culture: 10000 — AB

## 2023-09-21 ENCOUNTER — Telehealth (HOSPITAL_BASED_OUTPATIENT_CLINIC_OR_DEPARTMENT_OTHER): Payer: Self-pay | Admitting: *Deleted

## 2023-09-21 DIAGNOSIS — B3731 Acute candidiasis of vulva and vagina: Secondary | ICD-10-CM

## 2023-09-21 LAB — GC/CHLAMYDIA PROBE AMP (~~LOC~~) NOT AT ARMC
Chlamydia: POSITIVE — AB
Comment: NEGATIVE
Comment: NORMAL
Neisseria Gonorrhea: NEGATIVE

## 2023-09-21 NOTE — Telephone Encounter (Signed)
Post ED Visit - Positive Culture Follow-up  Culture report reviewed by antimicrobial stewardship pharmacist: [facility] []  Enzo Bi, Pharm.D. []  Celedonio Miyamoto, Pharm.D., BCPS AQ-ID []  Garvin Fila, Pharm.D., BCPS []  Georgina Pillion, Pharm.D., BCPS []  [place], [place].D., BCPS, AAHIVP []  Estella Husk, Pharm.D., BCPS, AAHIVP []  Lysle Pearl, PharmD, BCPS []  Goodloe Climes, PharmD, BCPS []  Agapito Games, PharmD, BCPS []  Verlan Friends, PharmD []  Mervyn Gay, PharmD, BCPS [x]  Lennie Muckle, PharmD  [facility] []  Len Childs, PharmD []  Greer Pickerel, PharmD []  Adalberto Cole, PharmD []  Perlie Gold, Rph []  Lonell Face) Jean Rosenthal, PharmD []  Earl Many, PharmD []  Junita Push, PharmD []  Dorna Leitz, PharmD []  Terrilee Files, PharmD []  Lynann Beaver, PharmD []  Keturah Barre, PharmD []  Loralee Pacas, PharmD []  Bernadene Person, PharmD   Positive urine culture Treated with Cephalexin, Metronidazole, & Sulfamethoxazole-Trimethoprim, and no further patient follow-up is required at this time.  [name] 09/21/2023, 8:43 AM

## 2023-09-22 ENCOUNTER — Telehealth: Payer: Self-pay

## 2023-09-22 MED ORDER — FLUCONAZOLE 200 MG PO TABS: 200.0000 mg | ORAL_TABLET | Freq: Every day | ORAL | 0 refills | Status: AC

## 2023-09-22 MED ORDER — DOXYCYCLINE HYCLATE 100 MG PO CAPS: 100.0000 mg | ORAL_CAPSULE | Freq: Two times a day (BID) | ORAL | 0 refills | Status: AC

## 2023-09-22 NOTE — Telephone Encounter (Signed)
I was notified on shift at the patient and positive chlamydia test results and wanted to discuss these.  He discussed with her on the phone her results.  She has not been treated appropriately for chlamydia and will need doxycycline which was called into her pharmacy, as well as additional Diflucan if she develops yeast infection symptoms.  She can continue the metronidazole for BV treatment per her last workup in the ED.  I reviewed her urine culture which is 10,000 mixed colony units, likely contaminant, does not appear consistent with a true urinary tract infection.  Therefore I will have her discontinue her cephalexin and her Bactrim.  She verbalized understanding with this plan was able to repeat it back to me.  She is also having her sexual partner tested and treated.

## 2023-10-10 ENCOUNTER — Telehealth: Payer: Self-pay | Admitting: Nurse Practitioner

## 2023-10-10 DIAGNOSIS — B3731 Acute candidiasis of vulva and vagina: Secondary | ICD-10-CM

## 2023-10-10 MED ORDER — FLUCONAZOLE 150 MG PO TABS
ORAL_TABLET | ORAL | 0 refills | Status: DC
Start: 2023-10-10 — End: 2023-12-04

## 2023-10-10 NOTE — Progress Notes (Signed)
Virtual Visit Consent   Dorothy Whitaker, you are scheduled for a virtual visit with a [facility] provider today. Just as with appointments in the office, your consent must be obtained to participate. Your consent will be active for this visit and any virtual visit you may have with one of our providers in the next 365 days. If you have a MyChart account, a copy of this consent can be sent to you electronically.  As this is a virtual visit, video technology does not allow for your provider to perform a traditional examination. This may limit your provider's ability to fully assess your condition. If your provider identifies any concerns that need to be evaluated in person or the need to arrange testing (such as labs, EKG, etc.), we will make arrangements to do so. Although advances in technology are sophisticated, we cannot ensure that it will always work on either your end or our end. If the connection with a video visit is poor, the visit may have to be switched to a telephone visit. With either a video or telephone visit, we are not always able to ensure that we have a secure connection.  By engaging in this virtual visit, you consent to the provision of healthcare and authorize for your insurance to be billed (if applicable) for the services provided during this visit. Depending on your insurance coverage, you may receive a charge related to this service.  I need to obtain your verbal consent now. Are you willing to proceed with your visit today? Dorothy Whitaker has provided verbal consent on 10/10/2023 for a virtual visit (video or telephone). Claiborne Rigg, NP  Date: 10/10/2023 6:07 PM  Virtual Visit via Video Note   I, Claiborne Rigg, connected with  Dorothy Whitaker  ([id number], Feb 12, 1996) on 10/10/23 at  6:00 PM EST by a video-enabled telemedicine application and verified that I am speaking with the correct person using two identifiers.  Location: Patient: Virtual Visit Location  Patient: Other: restaurant Provider: Virtual Visit Location Provider: Home Office   I discussed the limitations of evaluation and management by telemedicine and the availability of in person appointments. The patient expressed understanding and agreed to proceed.    History of Present Illness: Dorothy Whitaker is a 27 y.o. who identifies as a female who was assigned female at birth, and is being seen today for Re occurring yeast vaginitis.  Ms. Engebretsen notes re occurring symptoms of vaginal itching and burning. Symptom started earlier this week. She is requesting an alternative to the tablet due to side effects but also declines vaginal suppository or applicator. Denies any other GU symptoms   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   Panic attack 06/26/2021   Symptomatic anemia 11/15/2019   Heavy menstrual bleeding 11/15/2019   Asthma    Acanthosis nigricans 09/21/2015   Body mass index (BMI) of 36.0-36.9 in adult 07/17/2015    Allergies: No Known Allergies Medications:  Current Outpatient Medications:    albuterol (VENTOLIN HFA) 108 (90 Base) MCG/ACT inhaler, Inhale 2 puffs into the lungs every 6 (six) hours as needed for wheezing or shortness of breath., Disp: 8 g, Rfl: 0   azelastine (ASTELIN) 0.1 % nasal spray, Place 1 spray into both nostrils 2 (two) times daily. Use in each nostril as directed, Disp: 30 mL, Rfl: 0   Blood Glucose Monitoring Suppl DEVI, 1 each by  Does not apply route in the morning, at noon, and at bedtime. May substitute to any manufacturer covered by patient's insurance., Disp: 1 each, Rfl: 0   clonazePAM (KLONOPIN) 0.5 MG tablet, Take 1 tablet (0.5 mg total) by mouth 2 (two) times daily as needed for anxiety., Disp: 20 tablet, Rfl: 0   ferrous sulfate 300 (60 Fe) MG/5ML syrup, Take 5 mLs (300 mg total) by mouth daily., Disp: 500 mL, Rfl: 0   fluconazole  (DIFLUCAN) 150 MG tablet, Take 1 tablet PO once. Repeat in 3 days if needed., Disp: 2 tablet, Rfl: 0   FLUoxetine (PROZAC) 20 MG capsule, Take 1 capsule (20 mg total) by mouth daily., Disp: 30 capsule, Rfl: 0   hydrOXYzine (ATARAX) 10 MG tablet, Take 1 tablet (10 mg total) by mouth 3 (three) times daily as needed for anxiety., Disp: 30 tablet, Rfl: 0   levocetirizine (XYZAL) 5 MG tablet, Take 1 tablet (5 mg total) by mouth every evening., Disp: 30 tablet, Rfl: 0   metFORMIN (GLUCOPHAGE) 500 MG tablet, Take 1 tablet (500 mg total) by mouth daily with breakfast., Disp: 30 tablet, Rfl: 1   neomycin-polymyxin-hydrocortisone (CORTISPORIN) OTIC solution, Place 3 drops into the left ear 4 (four) times daily. X 7 days, Disp: 10 mL, Rfl: 0   phenazopyridine (PYRIDIUM) 100 MG tablet, Take 1 tablet (100 mg total) by mouth 3 (three) times daily as needed for pain., Disp: 10 tablet, Rfl: 0   Vitamin D, Ergocalciferol, (DRISDOL) 1.25 MG (50000 UNIT) CAPS capsule, Take 1 capsule (50,000 Units total) by mouth every 7 (seven) days. (Patient not taking: Reported on 08/06/2022), Disp: 12 capsule, Rfl: 0  Observations/Objective: Patient is well-developed, well-nourished in no acute distress.  Resting comfortably at home.  Head is normocephalic, atraumatic.  No labored breathing.  Speech is clear and coherent with logical content.  Patient is alert and oriented at baseline.    Assessment and Plan: 1. Yeast vaginitis - fluconazole (DIFLUCAN) 150 MG tablet; Take 1 tablet PO once. Repeat in 3 days if needed.  Dispense: 2 tablet; Refill: 0  Instructed that her last glucose was almost 300. I do not see in her chart where she is seeing a PCP however she states she has a PCP in [place] who has her on metformin. I recommended she follow up as poorly controlled DM will lead to re occurring yeast vaginitis.   Follow Up Instructions: I discussed the assessment and treatment plan with the patient. The patient was  provided an opportunity to ask questions and all were answered. The patient agreed with the plan and demonstrated an understanding of the instructions.  A copy of instructions were sent to the patient via MyChart unless otherwise noted below.    The patient was advised to call back or seek an in-person evaluation if the symptoms worsen or if the condition fails to improve as anticipated.    Claiborne Rigg, NP

## 2023-10-10 NOTE — Patient Instructions (Signed)
[name], thank you for joining Claiborne Rigg, NP for today's virtual visit.  While this provider is not your primary care provider (PCP), if your PCP is located in our provider database this encounter information will be shared with them immediately following your visit.   A [facility] MyChart account gives you access to today's visit and all your visits, tests, and labs performed at [facility] " click here if you don't have a [facility] MyChart account or go to [email or web address]  Consent: (Patient) [name] provided verbal consent for this virtual visit at the beginning of the encounter.  Current Medications:  Current Outpatient Medications:    albuterol (VENTOLIN HFA) 108 (90 Base) MCG/ACT inhaler, Inhale 2 puffs into the lungs every 6 (six) hours as needed for wheezing or shortness of breath., Disp: 8 g, Rfl: 0   azelastine (ASTELIN) 0.1 % nasal spray, Place 1 spray into both nostrils 2 (two) times daily. Use in each nostril as directed, Disp: 30 mL, Rfl: 0   Blood Glucose Monitoring Suppl DEVI, 1 each by Does not apply route in the morning, at noon, and at bedtime. May substitute to any manufacturer covered by patient's insurance., Disp: 1 each, Rfl: 0   clonazePAM (KLONOPIN) 0.5 MG tablet, Take 1 tablet (0.5 mg total) by mouth 2 (two) times daily as needed for anxiety., Disp: 20 tablet, Rfl: 0   ferrous sulfate 300 (60 Fe) MG/5ML syrup, Take 5 mLs (300 mg total) by mouth daily., Disp: 500 mL, Rfl: 0   fluconazole (DIFLUCAN) 150 MG tablet, Take 1 tablet PO once. Repeat in 3 days if needed., Disp: 2 tablet, Rfl: 0   FLUoxetine (PROZAC) 20 MG capsule, Take 1 capsule (20 mg total) by mouth daily., Disp: 30 capsule, Rfl: 0   hydrOXYzine (ATARAX) 10 MG tablet, Take 1 tablet (10 mg total) by mouth 3 (three) times daily as needed for anxiety., Disp: 30 tablet, Rfl: 0   levocetirizine (XYZAL) 5 MG tablet, Take 1 tablet (5 mg total) by mouth every evening.,  Disp: 30 tablet, Rfl: 0   metFORMIN (GLUCOPHAGE) 500 MG tablet, Take 1 tablet (500 mg total) by mouth daily with breakfast., Disp: 30 tablet, Rfl: 1   neomycin-polymyxin-hydrocortisone (CORTISPORIN) OTIC solution, Place 3 drops into the left ear 4 (four) times daily. X 7 days, Disp: 10 mL, Rfl: 0   phenazopyridine (PYRIDIUM) 100 MG tablet, Take 1 tablet (100 mg total) by mouth 3 (three) times daily as needed for pain., Disp: 10 tablet, Rfl: 0   Vitamin D, Ergocalciferol, (DRISDOL) 1.25 MG (50000 UNIT) CAPS capsule, Take 1 capsule (50,000 Units total) by mouth every 7 (seven) days. (Patient not taking: Reported on 08/06/2022), Disp: 12 capsule, Rfl: 0   Medications ordered in this encounter:  Meds ordered this encounter  Medications   fluconazole (DIFLUCAN) 150 MG tablet    Sig: Take 1 tablet PO once. Repeat in 3 days if needed.    Dispense:  2 tablet    Refill:  0    Order Specific Question:   Supervising Provider    Answer:   Merrilee Jansky [id number]     *If you need refills on other medications prior to your next appointment, please contact your pharmacy*  Follow-Up: Call back or seek an in-person evaluation if the symptoms worsen or if the condition fails to improve as anticipated.   Virtual Care [phone]  Other Instructions  Instructed that her last glucose was almost 300. I  do not see in her chart where she is seeing a PCP however she states she has a PCP in [place] who has her on metformin. I recommended she follow up as poorly controlled DM will lead to re occurring yeast vaginitis.   If you have been instructed to have an in-person evaluation today at a local Urgent Care facility, please use the link below. It will take you to a list of all of our available [facility], including address, phone number and hours of operation. Please do not delay care.  [facility]  If you or a family member do not have a primary care provider,  use the link below to schedule a visit and establish care. When you choose a [facility] primary care physician or advanced practice provider, you gain a long-term partner in health. Find a Primary Care Provider  Learn more about [facility]'s in-office and virtual care options: [facility] - Get Care Now

## 2023-11-05 ENCOUNTER — Telehealth: Payer: Self-pay | Admitting: Nurse Practitioner

## 2023-11-05 DIAGNOSIS — B9689 Other specified bacterial agents as the cause of diseases classified elsewhere: Secondary | ICD-10-CM

## 2023-11-05 DIAGNOSIS — N760 Acute vaginitis: Secondary | ICD-10-CM

## 2023-11-05 MED ORDER — METRONIDAZOLE 0.75 % VA GEL: 1.0000 | Freq: Every day | VAGINAL | 0 refills | Status: AC

## 2023-11-05 NOTE — Progress Notes (Signed)
 Virtual Visit Consent   JORY WELKE, you are scheduled for a virtual visit with a  provider today. Just as with appointments in the office, your consent must be obtained to participate. Your consent will be active for this visit and any virtual visit you may have with one of our providers in the next 365 days. If you have a MyChart account, a copy of this consent can be sent to you electronically.  As this is a virtual visit, video technology does not allow for your provider to perform a traditional examination. This may limit your provider's ability to fully assess your condition. If your provider identifies any concerns that need to be evaluated in person or the need to arrange testing (such as labs, EKG, etc.), we will make arrangements to do so. Although advances in technology are sophisticated, we cannot ensure that it will always work on either your end or our end. If the connection with a video visit is poor, the visit may have to be switched to a telephone visit. With either a video or telephone visit, we are not always able to ensure that we have a secure connection.  By engaging in this virtual visit, you consent to the provision of healthcare and authorize for your insurance to be billed (if applicable) for the services provided during this visit. Depending on your insurance coverage, you may receive a charge related to this service.  I need to obtain your verbal consent now. Are you willing to proceed with your visit today? Dorothy Whitaker has provided verbal consent on 11/05/2023 for a virtual visit (video or telephone). Lauraine Kitty, FNP  Date: 11/05/2023 5:46 PM  Virtual Visit via Video Note   I, Lauraine Kitty, connected with  Dorothy Whitaker  ([id number], 1996-03-10) on 11/05/23 at  5:45 PM EST by a video-enabled telemedicine application and verified that I am speaking with the correct person using two identifiers.  Location: Patient: Virtual Visit Location Patient:  Home Provider: Virtual Visit Location Provider: Home Office   I discussed the limitations of evaluation and management by telemedicine and the availability of in person appointments. The patient expressed understanding and agreed to proceed.    History of Present Illness: Dorothy Whitaker is a 28 y.o. who identifies as a female who was assigned female at birth, and is being seen today for ongoing vagina discharge   She was treated in November for Chlamydia a yeast infection and BV   Since that time she has had recurrent vaginal irritation, she had a VV one month ago and was given Diflucan  for symptoms and she feels that made symptoms worse   Her follow up with PCP is in February   Today she has vaginal discharge that is clear/jelly without noted odor   Her partner was also treated for Chlamydia  He does not have symptoms   Denies urinary symptoms but does have some discomfort with wiping   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   Panic attack 06/26/2021   Symptomatic anemia 11/15/2019   Heavy menstrual bleeding 11/15/2019   Asthma    Acanthosis nigricans 09/21/2015   Body mass index (BMI) of 36.0-36.9 in adult 07/17/2015    Allergies: No Known Allergies Medications:  Current Outpatient Medications:    albuterol  (VENTOLIN  HFA) 108 (90 Base) MCG/ACT inhaler, Inhale 2 puffs into the lungs every 6 (six) hours as needed  for wheezing or shortness of breath., Disp: 8 g, Rfl: 0   azelastine  (ASTELIN ) 0.1 % nasal spray, Place 1 spray into both nostrils 2 (two) times daily. Use in each nostril as directed, Disp: 30 mL, Rfl: 0   Blood Glucose Monitoring Suppl DEVI, 1 each by Does not apply route in the morning, at noon, and at bedtime. May substitute to any manufacturer covered by patient's insurance., Disp: 1 each, Rfl: 0   clonazePAM  (KLONOPIN ) 0.5 MG tablet, Take 1 tablet (0.5 mg  total) by mouth 2 (two) times daily as needed for anxiety., Disp: 20 tablet, Rfl: 0   ferrous sulfate  300 (60 Fe) MG/5ML syrup, Take 5 mLs (300 mg total) by mouth daily., Disp: 500 mL, Rfl: 0   fluconazole  (DIFLUCAN ) 150 MG tablet, Take 1 tablet PO once. Repeat in 3 days if needed., Disp: 2 tablet, Rfl: 0   FLUoxetine  (PROZAC ) 20 MG capsule, Take 1 capsule (20 mg total) by mouth daily., Disp: 30 capsule, Rfl: 0   hydrOXYzine  (ATARAX ) 10 MG tablet, Take 1 tablet (10 mg total) by mouth 3 (three) times daily as needed for anxiety., Disp: 30 tablet, Rfl: 0   levocetirizine (XYZAL ) 5 MG tablet, Take 1 tablet (5 mg total) by mouth every evening., Disp: 30 tablet, Rfl: 0   metFORMIN  (GLUCOPHAGE ) 500 MG tablet, Take 1 tablet (500 mg total) by mouth daily with breakfast., Disp: 30 tablet, Rfl: 1   neomycin -polymyxin-hydrocortisone  (CORTISPORIN) OTIC solution, Place 3 drops into the left ear 4 (four) times daily. X 7 days, Disp: 10 mL, Rfl: 0   phenazopyridine  (PYRIDIUM ) 100 MG tablet, Take 1 tablet (100 mg total) by mouth 3 (three) times daily as needed for pain., Disp: 10 tablet, Rfl: 0   Vitamin D , Ergocalciferol , (DRISDOL ) 1.25 MG (50000 UNIT) CAPS capsule, Take 1 capsule (50,000 Units total) by mouth every 7 (seven) days. (Patient not taking: Reported on 08/06/2022), Disp: 12 capsule, Rfl: 0  Observations/Objective: Patient is well-developed, well-nourished in no acute distress.  Resting comfortably  at home.  Head is normocephalic, atraumatic.  No labored breathing.  Speech is clear and coherent with logical content.  Patient is alert and oriented at baseline.    Assessment and Plan:   1. Bacterial vaginitis (Primary)  If no improvement advised retesting with PCP or UC   - metroNIDAZOLE  (METROGEL ) 0.75 % vaginal gel; Place 1 Applicatorful vaginally at bedtime for 7 days.  Dispense: 70 g; Refill: 0     Follow Up Instructions: I discussed the assessment and treatment plan with the patient.  The patient was provided an opportunity to ask questions and all were answered. The patient agreed with the plan and demonstrated an understanding of the instructions.  A copy of instructions were sent to the patient via MyChart unless otherwise noted below.    The patient was advised to call back or seek an in-person evaluation if the symptoms worsen or if the condition fails to improve as anticipated.    Lauraine Kitty, FNP

## 2023-12-03 ENCOUNTER — Telehealth: Payer: Self-pay | Admitting: Physician Assistant

## 2023-12-03 DIAGNOSIS — N760 Acute vaginitis: Secondary | ICD-10-CM

## 2023-12-03 DIAGNOSIS — Z202 Contact with and (suspected) exposure to infections with a predominantly sexual mode of transmission: Secondary | ICD-10-CM

## 2023-12-03 NOTE — Patient Instructions (Signed)
Dorothy Whitaker, thank you for joining [name], PA-C for today's virtual visit.  While this provider is not your primary care provider (PCP), if your PCP is located in our provider database this encounter information will be shared with them immediately following your visit.   A [facility] MyChart account gives you access to today's visit and all your visits, tests, and labs performed at [facility] " click here if you don't have a [facility] MyChart account or go to [email or web address]  Consent: (Patient) Dorothy Whitaker provided verbal consent for this virtual visit at the beginning of the encounter.  Current Medications:  Current Outpatient Medications:    albuterol (VENTOLIN HFA) 108 (90 Base) MCG/ACT inhaler, Inhale 2 puffs into the lungs every 6 (six) hours as needed for wheezing or shortness of breath., Disp: 8 g, Rfl: 0   azelastine (ASTELIN) 0.1 % nasal spray, Place 1 spray into both nostrils 2 (two) times daily. Use in each nostril as directed, Disp: 30 mL, Rfl: 0   Blood Glucose Monitoring Suppl DEVI, 1 each by Does not apply route in the morning, at noon, and at bedtime. May substitute to any manufacturer covered by patient's insurance., Disp: 1 each, Rfl: 0   clonazePAM (KLONOPIN) 0.5 MG tablet, Take 1 tablet (0.5 mg total) by mouth 2 (two) times daily as needed for anxiety., Disp: 20 tablet, Rfl: 0   ferrous sulfate 300 (60 Fe) MG/5ML syrup, Take 5 mLs (300 mg total) by mouth daily., Disp: 500 mL, Rfl: 0   fluconazole (DIFLUCAN) 150 MG tablet, Take 1 tablet PO once. Repeat in 3 days if needed., Disp: 2 tablet, Rfl: 0   FLUoxetine (PROZAC) 20 MG capsule, Take 1 capsule (20 mg total) by mouth daily., Disp: 30 capsule, Rfl: 0   hydrOXYzine (ATARAX) 10 MG tablet, Take 1 tablet (10 mg total) by mouth 3 (three) times daily as needed for anxiety., Disp: 30 tablet, Rfl: 0   levocetirizine (XYZAL) 5 MG tablet, Take 1 tablet (5 mg total) by mouth every  evening., Disp: 30 tablet, Rfl: 0   metFORMIN (GLUCOPHAGE) 500 MG tablet, Take 1 tablet (500 mg total) by mouth daily with breakfast., Disp: 30 tablet, Rfl: 1   neomycin-polymyxin-hydrocortisone (CORTISPORIN) OTIC solution, Place 3 drops into the left ear 4 (four) times daily. X 7 days, Disp: 10 mL, Rfl: 0   phenazopyridine (PYRIDIUM) 100 MG tablet, Take 1 tablet (100 mg total) by mouth 3 (three) times daily as needed for pain., Disp: 10 tablet, Rfl: 0   Vitamin D, Ergocalciferol, (DRISDOL) 1.25 MG (50000 UNIT) CAPS capsule, Take 1 capsule (50,000 Units total) by mouth every 7 (seven) days. (Patient not taking: Reported on 08/06/2022), Disp: 12 capsule, Rfl: 0   Medications ordered in this encounter:  No orders of the defined types were placed in this encounter.    *If you need refills on other medications prior to your next appointment, please contact your pharmacy*  Follow-Up: Call back or seek an in-person evaluation if the symptoms worsen or if the condition fails to improve as anticipated.  [facility] [phone]  Other Instructions  If you have been instructed to have an in-person evaluation today at a local Urgent Care facility, please use the link below. It will take you to a list of all of our available [facility], including address, phone number and hours of operation. Please do not delay care.   Urgent Cares  Consider looking into  [facility] in [place] or [facility] in [place].  If you or a family member do not have a primary care provider, use the link below to schedule a visit and establish care. When you choose a [facility] primary care physician or advanced practice provider, you gain a long-term partner in health. Find a Primary Care Provider  I recommend [facility] -- they have multiple offices so you can find a convenient location.  Learn more about [facility]'s in-office and virtual care options: [facility] - Get Care Now

## 2023-12-03 NOTE — Progress Notes (Signed)
Virtual Visit Consent   Dorothy Whitaker, you are scheduled for a virtual visit with a [facility] provider today. Just as with appointments in the office, your consent must be obtained to participate. Your consent will be active for this visit and any virtual visit you may have with one of our providers in the next 365 days. If you have a MyChart account, a copy of this consent can be sent to you electronically.  As this is a virtual visit, video technology does not allow for your provider to perform a traditional examination. This may limit your provider's ability to fully assess your condition. If your provider identifies any concerns that need to be evaluated in person or the need to arrange testing (such as labs, EKG, etc.), we will make arrangements to do so. Although advances in technology are sophisticated, we cannot ensure that it will always work on either your end or our end. If the connection with a video visit is poor, the visit may have to be switched to a telephone visit. With either a video or telephone visit, we are not always able to ensure that we have a secure connection.  By engaging in this virtual visit, you consent to the provision of healthcare and authorize for your insurance to be billed (if applicable) for the services provided during this visit. Depending on your insurance coverage, you may receive a charge related to this service.  I need to obtain your verbal consent now. Are you willing to proceed with your visit today? Dorothy Whitaker has provided verbal consent on 12/03/2023 for a virtual visit (video or telephone). [name], [place]  Date: 12/03/2023 6:29 PM  Virtual Visit via Video Note   I, [name], connected with  Dorothy Whitaker  ([id number], [birth year]) on 12/03/23 at  6:15 PM EST by a video-enabled telemedicine application and verified that I am speaking with the correct person using two identifiers.  Location: Patient: Virtual Visit  Location Patient: Home Provider: Virtual Visit Location Provider: Home Office   I discussed the limitations of evaluation and management by telemedicine and the availability of in person appointments. The patient expressed understanding and agreed to proceed.    History of Present Illness: Dorothy Whitaker is a 28 y.o. who identifies as a female who was assigned female at birth, and is being seen today for concern about reexposure to STI from female partner. Notes they are only sexually active with each other and bother were treated a couple of months back so she is unsure if this did not clear. Notes few days of pelvic discomfort and discharge. Also in past few months dealt with yeast/bv and UTI. Notes some urinary frequency and discomfort but very mild. Is without PCP currently and unsure what the best course of action would be.  HPI: HPI  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   Panic attack 06/26/2021   Symptomatic anemia 11/15/2019   Heavy menstrual bleeding 11/15/2019   Asthma    Acanthosis nigricans 09/21/2015   Body mass index (BMI) of 36.0-36.9 in adult 07/17/2015    Allergies: No Known Allergies Medications:  Current Outpatient Medications:    azelastine (ASTELIN) 0.1 % nasal spray, Place 1 spray into both nostrils 2 (two) times daily. Use in each nostril as directed, Disp: 30 mL, Rfl: 0   Blood Glucose Monitoring Suppl DEVI, 1 each by Does  not apply route in the morning, at noon, and at bedtime. May substitute to any manufacturer covered by patient's insurance., Disp: 1 each, Rfl: 0   clonazePAM (KLONOPIN) 0.5 MG tablet, Take 1 tablet (0.5 mg total) by mouth 2 (two) times daily as needed for anxiety., Disp: 20 tablet, Rfl: 0   ferrous sulfate 300 (60 Fe) MG/5ML syrup, Take 5 mLs (300 mg total) by mouth daily., Disp: 500 mL, Rfl: 0   fluconazole (DIFLUCAN) 150 MG tablet, Take 1  tablet PO once. Repeat in 3 days if needed., Disp: 2 tablet, Rfl: 0   FLUoxetine (PROZAC) 20 MG capsule, Take 1 capsule (20 mg total) by mouth daily., Disp: 30 capsule, Rfl: 0   hydrOXYzine (ATARAX) 10 MG tablet, Take 1 tablet (10 mg total) by mouth 3 (three) times daily as needed for anxiety., Disp: 30 tablet, Rfl: 0   levocetirizine (XYZAL) 5 MG tablet, Take 1 tablet (5 mg total) by mouth every evening., Disp: 30 tablet, Rfl: 0   metFORMIN (GLUCOPHAGE) 500 MG tablet, Take 1 tablet (500 mg total) by mouth daily with breakfast., Disp: 30 tablet, Rfl: 1   phenazopyridine (PYRIDIUM) 100 MG tablet, Take 1 tablet (100 mg total) by mouth 3 (three) times daily as needed for pain., Disp: 10 tablet, Rfl: 0   Vitamin D, Ergocalciferol, (DRISDOL) 1.25 MG (50000 UNIT) CAPS capsule, Take 1 capsule (50,000 Units total) by mouth every 7 (seven) days. (Patient not taking: Reported on 08/06/2022), Disp: 12 capsule, Rfl: 0  Observations/Objective: Patient is well-developed, well-nourished in no acute distress.  Resting comfortably  at home.  Head is normocephalic, atraumatic.  No labored breathing.  Speech is clear and coherent with logical content.  Patient is alert and oriented at baseline.   Assessment and Plan: 1. Recurrent vaginitis (Primary)  2. Possible exposure to STI  Discussed need for in person evaluation for examination and testing so that proper diagnosis can be made and treatment given. Also need to make sure to rule out concern for PID giving possible lingering chlamydia. She agrees to be seen at one of our in person urgent cares tomorrow morning. Resources also given to help her establish with a new PCP.  Follow Up Instructions: I discussed the assessment and treatment plan with the patient. The patient was provided an opportunity to ask questions and all were answered. The patient agreed with the plan and demonstrated an understanding of the instructions.  A copy of instructions were sent to  the patient via MyChart unless otherwise noted below.   The patient was advised to call back or seek an in-person evaluation if the symptoms worsen or if the condition fails to improve as anticipated.    [name], PA-C

## 2023-12-04 ENCOUNTER — Ambulatory Visit
Admission: EM | Admit: 2023-12-04 | Discharge: 2023-12-04 | Disposition: A | Discharge status: Home / Self Care | Payer: Self-pay | Attending: Internal Medicine | Admitting: Internal Medicine

## 2023-12-04 ENCOUNTER — Encounter: Payer: Self-pay | Admitting: Emergency Medicine

## 2023-12-04 ENCOUNTER — Ambulatory Visit: Payer: Self-pay

## 2023-12-04 DIAGNOSIS — N898 Other specified noninflammatory disorders of vagina: Secondary | ICD-10-CM | POA: Insufficient documentation

## 2023-12-04 DIAGNOSIS — Z9189 Other specified personal risk factors, not elsewhere classified: Secondary | ICD-10-CM | POA: Insufficient documentation

## 2023-12-04 MED ORDER — FLUCONAZOLE 150 MG PO TABS: 150.0000 mg | ORAL_TABLET | ORAL | 0 refills | Status: DC

## 2023-12-04 NOTE — ED Triage Notes (Signed)
Pt presents for std. She has had vaginal burning, itching, and clear discharge that began a few days ago. This was first time she has been sexually active since last STD diagnosis.

## 2023-12-04 NOTE — Discharge Instructions (Signed)

## 2023-12-04 NOTE — ED Provider Notes (Signed)
[facility]    CSN: [id number] Arrival date & time: 12/04/23  1741      History   Chief Complaint Chief Complaint  Patient presents with   Vaginal Itching   SEXUALLY TRANSMITTED DISEASE    HPI Dorothy Whitaker is a 28 y.o. female.   Dorothy Whitaker is a 27 y.o. female presenting for chief complaint of vaginal itching, vaginal discharge, and vaginal irritation that started a few days ago after sexual intercourse.  Reports she was diagnosed with chlamydia, yeast infection, and BV back in November 2024 when she was sexually active with her partner.  She has not been sexually active in the last few months until a few days ago with the same partner as in November and symptoms began again.  She did an e-visit where she was prescribed MetroGel for suspected BV and has used 1 dose of this without much relief of symptoms.  Denies urinary symptoms, abdominal pain, nausea, vomiting, diarrhea, abdominal pain, flank pain, fever, and chills.  No recent antibiotic or steroid use reported.  Denies other known exposures to STDs.  She does not use contraception and her last menstrual cycle was November 23, 2023.  She is confident that she is not pregnant.   Vaginal Itching    Past Medical History:  Diagnosis Date   Anemia    Asthma    Encounter for blood transfusion 2021    Patient Active Problem List   Diagnosis Date Noted   GAD (generalized anxiety disorder) 06/26/2021   Unresolved grief 06/26/2021   Panic attack 06/26/2021   Symptomatic anemia 11/15/2019   Heavy menstrual bleeding 11/15/2019   Asthma    Acanthosis nigricans 09/21/2015   Body mass index (BMI) of 36.0-36.9 in adult 07/17/2015    History reviewed. No pertinent surgical history.  OB History   No obstetric history on file.      Home Medications    Prior to Admission medications   Medication Sig Start Date End Date Taking? Authorizing Provider  fluconazole (DIFLUCAN) 150 MG tablet Take 1 tablet (150  mg total) by mouth every 3 (three) days. 12/04/23  Yes Carlisle Beers, FNP  azelastine (ASTELIN) 0.1 % nasal spray Place 1 spray into both nostrils 2 (two) times daily. Use in each nostril as directed 08/05/23   Waldon Merl, PA-C  Blood Glucose Monitoring Suppl DEVI 1 each by Does not apply route in the morning, at noon, and at bedtime. May substitute to any manufacturer covered by patient's insurance. 12/21/22   Achille Rich, PA-C  clonazePAM (KLONOPIN) 0.5 MG tablet Take 1 tablet (0.5 mg total) by mouth 2 (two) times daily as needed for anxiety. Patient not taking: Reported on 12/04/2023 08/06/22   Shade Flood, MD  ferrous sulfate 300 (60 Fe) MG/5ML syrup Take 5 mLs (300 mg total) by mouth daily. 09/11/23   Margaretann Loveless, PA-C  FLUoxetine (PROZAC) 20 MG capsule Take 1 capsule (20 mg total) by mouth daily. Patient not taking: Reported on 12/04/2023 01/16/23   Waldon Merl, PA-C  hydrOXYzine (ATARAX) 10 MG tablet Take 1 tablet (10 mg total) by mouth 3 (three) times daily as needed for anxiety. Patient not taking: Reported on 12/04/2023 01/16/23   Waldon Merl, PA-C  levocetirizine (XYZAL) 5 MG tablet Take 1 tablet (5 mg total) by mouth every evening. Patient not taking: Reported on 12/04/2023 08/05/23   Waldon Merl, PA-C  metFORMIN (GLUCOPHAGE) 500 MG tablet Take 1 tablet (500 mg  total) by mouth daily with breakfast. Patient not taking: Reported on 12/04/2023 12/21/22   Achille Rich, PA-C  phenazopyridine (PYRIDIUM) 100 MG tablet Take 1 tablet (100 mg total) by mouth 3 (three) times daily as needed for pain. Patient not taking: Reported on 12/04/2023 09/17/23   Margaretann Loveless, PA-C  Vitamin D, Ergocalciferol, (DRISDOL) 1.25 MG (50000 UNIT) CAPS capsule Take 1 capsule (50,000 Units total) by mouth every 7 (seven) days. Patient not taking: Reported on 08/06/2022 07/04/21   [name], NP    Family History Family History  Problem Relation Age of Onset    Diabetes Mother    Diabetes Maternal Aunt    Diabetes type I Niece     Social History Social History   Tobacco Use   Smoking status: Never   Smokeless tobacco: Never  Vaping Use   Vaping status: Never Used  Substance Use Topics   Alcohol use: Yes    Comment: seldom   Drug use: No     Allergies   Patient has no known allergies.   Review of Systems Review of Systems Per HPI  Physical Exam Triage Vital Signs ED Triage Vitals  Encounter Vitals Group     BP --      Systolic BP Percentile --      Diastolic BP Percentile --      Pulse Rate 12/04/23 1803 (!) 109     Resp 12/04/23 1803 17     Temp 12/04/23 1803 97.7 F (36.5 C)     Temp Source 12/04/23 1803 Oral     SpO2 12/04/23 1803 97 %     Weight --      Height --      Head Circumference --      Peak Flow --      Pain Score 12/04/23 1801 5     Pain Loc --      Pain Education --      Exclude from Growth Chart --    No data found.  Updated Vital Signs Pulse (!) 109   Temp 97.7 F (36.5 C) (Oral)   Resp 17   LMP 11/23/2023 (Approximate)   SpO2 97%   Visual Acuity Right Eye Distance:   Left Eye Distance:   Bilateral Distance:    Right Eye Near:   Left Eye Near:    Bilateral Near:     Physical Exam Vitals and nursing note reviewed.  Constitutional:      Appearance: She is not ill-appearing or toxic-appearing.  HENT:     Head: Normocephalic and atraumatic.     Right Ear: Hearing and external ear normal.     Left Ear: Hearing and external ear normal.     Nose: Nose normal.     Mouth/Throat:     Lips: Pink.  Eyes:     General: Lids are normal. Vision grossly intact. Gaze aligned appropriately.     Extraocular Movements: Extraocular movements intact.     Conjunctiva/sclera: Conjunctivae normal.  Pulmonary:     Effort: Pulmonary effort is normal.  Genitourinary:    Comments: Deferred. Musculoskeletal:     Cervical back: Neck supple.  Skin:    General: Skin is warm and dry.     Capillary  Refill: Capillary refill takes less than 2 seconds.     Findings: No rash.  Neurological:     General: No focal deficit present.     Mental Status: She is alert and oriented to person, place, and time. Mental  status is at baseline.     Cranial Nerves: No dysarthria or facial asymmetry.  Psychiatric:        Mood and Affect: Mood normal.        Speech: Speech normal.        Behavior: Behavior normal.        Thought Content: Thought content normal.        Judgment: Judgment normal.      UC Treatments / Results  Labs (all labs ordered are listed, but only abnormal results are displayed) Labs Reviewed  CERVICOVAGINAL ANCILLARY ONLY    EKG   Radiology No results found.  Procedures Procedures (including critical care time)  Medications Ordered in UC Medications - No data to display  Initial Impression / Assessment and Plan / UC Course  I have reviewed the triage vital signs and the nursing notes.  Pertinent labs & imaging results that were available during my care of the patient were reviewed by me and considered in my medical decision making (see chart for details).   1.  At risk for sexually transmitted disease due to unprotected sex, vaginal discharge STI labs pending, will notify patient of positive results and treat accordingly per protocol when labs result.  She is currently taking MetroGel for BV, I would like to go ahead and prescribe Diflucan 1 pill today and then again in 3 days to treat suspected yeast vaginitis. Patient declines HIV and syphilis testing today stating this was done in November 2024.  Patient to avoid sexual intercourse until screening testing comes back.   Education provided regarding safe sexual practices and patient encouraged to use protection to prevent spread of STIs.    Counseled patient on potential for adverse effects with medications prescribed/recommended today, strict ER and return-to-clinic precautions discussed, patient verbalized  understanding.    Final Clinical Impressions(s) / UC Diagnoses   Final diagnoses:  At risk for sexually transmitted disease due to unprotected sex  Vaginal discharge     Discharge Instructions      STD testing pending, this will take 2-3 days to result. We will only call you if your testing is positive for any infection(s) and we will provide treatment.  Avoid sexual intercourse until your STD results come back.  If any of your STD results are positive, you will need to avoid sexual intercourse for 7 days while you are being treated to prevent spread of STD.  Condom use is the best way to prevent spread of STDs. Notify partner(s) of any positive results.  Return to urgent care as needed.      ED Prescriptions     Medication Sig Dispense Auth. Provider   fluconazole (DIFLUCAN) 150 MG tablet Take 1 tablet (150 mg total) by mouth every 3 (three) days. 2 tablet Carlisle Beers, FNP      PDMP not reviewed this encounter.   Carlisle Beers, [place] 12/04/23 1843

## 2023-12-07 ENCOUNTER — Telehealth: Payer: Self-pay

## 2023-12-07 ENCOUNTER — Telehealth (HOSPITAL_COMMUNITY): Payer: Self-pay

## 2023-12-07 LAB — CERVICOVAGINAL ANCILLARY ONLY
Bacterial Vaginitis (gardnerella): POSITIVE — AB
Candida Glabrata: NEGATIVE
Candida Vaginitis: POSITIVE — AB
Chlamydia: NEGATIVE
Comment: NEGATIVE
Comment: NEGATIVE
Comment: NEGATIVE
Comment: NEGATIVE
Comment: NEGATIVE
Comment: NORMAL
Neisseria Gonorrhea: NEGATIVE
Trichomonas: NEGATIVE

## 2023-12-07 MED ORDER — METRONIDAZOLE 0.75 % VA GEL: 1.0000 | Freq: Every day | VAGINAL | 0 refills | Status: AC

## 2023-12-07 NOTE — Telephone Encounter (Signed)
 Per protocol, pt requires tx with metronidazole. Rx sent to pharmacy on file.

## 2023-12-12 ENCOUNTER — Telehealth: Payer: Self-pay | Admitting: Nurse Practitioner

## 2023-12-12 DIAGNOSIS — J4520 Mild intermittent asthma, uncomplicated: Secondary | ICD-10-CM

## 2023-12-12 MED ORDER — BENZONATATE 200 MG PO CAPS: 200.0000 mg | ORAL_CAPSULE | Freq: Two times a day (BID) | ORAL | 0 refills | Status: AC | PRN

## 2023-12-12 MED ORDER — PSEUDOEPH-BROMPHEN-DM 30-2-10 MG/5ML PO SYRP: 5.0000 mL | ORAL_SOLUTION | Freq: Four times a day (QID) | ORAL | 0 refills | Status: AC | PRN

## 2023-12-12 MED ORDER — ALBUTEROL SULFATE HFA 108 (90 BASE) MCG/ACT IN AERS: 2.0000 | INHALATION_SPRAY | Freq: Four times a day (QID) | RESPIRATORY_TRACT | 0 refills | Status: AC | PRN

## 2023-12-12 NOTE — Progress Notes (Signed)
 Virtual Visit Consent   Dorothy Whitaker, you are scheduled for a virtual visit with a [facility] provider today. Just as with appointments in the office, your consent must be obtained to participate. Your consent will be active for this visit and any virtual visit you may have with one of our providers in the next 365 days. If you have a MyChart account, a copy of this consent can be sent to you electronically.  As this is a virtual visit, video technology does not allow for your provider to perform a traditional examination. This may limit your provider's ability to fully assess your condition. If your provider identifies any concerns that need to be evaluated in person or the need to arrange testing (such as labs, EKG, etc.), we will make arrangements to do so. Although advances in technology are sophisticated, we cannot ensure that it will always work on either your end or our end. If the connection with a video visit is poor, the visit may have to be switched to a telephone visit. With either a video or telephone visit, we are not always able to ensure that we have a secure connection.  By engaging in this virtual visit, you consent to the provision of healthcare and authorize for your insurance to be billed (if applicable) for the services provided during this visit. Depending on your insurance coverage, you may receive a charge related to this service.  I need to obtain your verbal consent now. Are you willing to proceed with your visit today? Dorothy Whitaker has provided verbal consent on 12/12/2023 for a virtual visit (video or telephone). Haze LELON Servant, NP  Date: 12/12/2023 8:43 AM  Virtual Visit via Video Note   I, Haze LELON Servant, connected with  Dorothy Whitaker  ([id number], Jul 31, 1996) on 12/12/23 at  8:30 AM EST by a video-enabled telemedicine application and verified that I am speaking with the correct person using two identifiers.  Location: Patient: Virtual Visit Location  Patient: Home Provider: Virtual Visit Location Provider: Home Office   I discussed the limitations of evaluation and management by telemedicine and the availability of in person appointments. The patient expressed understanding and agreed to proceed.    History of Present Illness: Dorothy Whitaker is a 28 y.o. who identifies as a female who was assigned female at birth, and is being seen today for Cough.  Ms Vessel has been experiencing cough (productive and non productive) over the past 24hours. She does have a history of asthma and is not currently using an inhaler. Does not endorse any other associated symptoms    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   Panic attack 06/26/2021   Symptomatic anemia 11/15/2019   Heavy menstrual bleeding 11/15/2019   Asthma    Acanthosis nigricans 09/21/2015   Body mass index (BMI) of 36.0-36.9 in adult 07/17/2015    Allergies: No Known Allergies Medications:  Current Outpatient Medications:    albuterol  (VENTOLIN  HFA) 108 (90 Base) MCG/ACT inhaler, Inhale 2 puffs into the lungs every 6 (six) hours as needed for wheezing or shortness of breath., Disp: 8 g, Rfl: 0   benzonatate  (TESSALON ) 200 MG capsule, Take 1 capsule (200 mg total) by mouth 2 (two) times daily as needed for cough., Disp: 20 capsule, Rfl: 0   brompheniramine-pseudoephedrine-DM 30-2-10 MG/5ML syrup, Take 5 mLs by mouth 4 (four) times daily as needed., Disp: 240  mL, Rfl: 0   metroNIDAZOLE  (METROGEL ) 0.75 % vaginal gel, Place 1 Applicatorful vaginally at bedtime for 5 days., Disp: 50 g, Rfl: 0   azelastine  (ASTELIN ) 0.1 % nasal spray, Place 1 spray into both nostrils 2 (two) times daily. Use in each nostril as directed, Disp: 30 mL, Rfl: 0   Blood Glucose Monitoring Suppl DEVI, 1 each by Does not apply route in the morning, at noon, and at bedtime. May substitute to any manufacturer  covered by patient's insurance., Disp: 1 each, Rfl: 0   clonazePAM  (KLONOPIN ) 0.5 MG tablet, Take 1 tablet (0.5 mg total) by mouth 2 (two) times daily as needed for anxiety. (Patient not taking: Reported on 12/04/2023), Disp: 20 tablet, Rfl: 0   ferrous sulfate  300 (60 Fe) MG/5ML syrup, Take 5 mLs (300 mg total) by mouth daily., Disp: 500 mL, Rfl: 0   fluconazole  (DIFLUCAN ) 150 MG tablet, Take 1 tablet (150 mg total) by mouth every 3 (three) days., Disp: 2 tablet, Rfl: 0   FLUoxetine  (PROZAC ) 20 MG capsule, Take 1 capsule (20 mg total) by mouth daily. (Patient not taking: Reported on 12/04/2023), Disp: 30 capsule, Rfl: 0   hydrOXYzine  (ATARAX ) 10 MG tablet, Take 1 tablet (10 mg total) by mouth 3 (three) times daily as needed for anxiety. (Patient not taking: Reported on 12/04/2023), Disp: 30 tablet, Rfl: 0   levocetirizine (XYZAL ) 5 MG tablet, Take 1 tablet (5 mg total) by mouth every evening. (Patient not taking: Reported on 12/04/2023), Disp: 30 tablet, Rfl: 0   metFORMIN  (GLUCOPHAGE ) 500 MG tablet, Take 1 tablet (500 mg total) by mouth daily with breakfast. (Patient not taking: Reported on 12/04/2023), Disp: 30 tablet, Rfl: 1   phenazopyridine  (PYRIDIUM ) 100 MG tablet, Take 1 tablet (100 mg total) by mouth 3 (three) times daily as needed for pain. (Patient not taking: Reported on 12/04/2023), Disp: 10 tablet, Rfl: 0   Vitamin D , Ergocalciferol , (DRISDOL ) 1.25 MG (50000 UNIT) CAPS capsule, Take 1 capsule (50,000 Units total) by mouth every 7 (seven) days. (Patient not taking: Reported on 08/06/2022), Disp: 12 capsule, Rfl: 0  Observations/Objective: Patient is well-developed, well-nourished in no acute distress.  Resting comfortably at home.  Head is normocephalic, atraumatic.  No labored breathing.  Speech is clear and coherent with logical content.  Patient is alert and oriented at baseline.    Assessment and Plan: 1. Mild intermittent asthma without complication (Primary) -  brompheniramine-pseudoephedrine-DM 30-2-10 MG/5ML syrup; Take 5 mLs by mouth 4 (four) times daily as needed.  Dispense: 240 mL; Refill: 0 - albuterol  (VENTOLIN  HFA) 108 (90 Base) MCG/ACT inhaler; Inhale 2 puffs into the lungs every 6 (six) hours as needed for wheezing or shortness of breath.  Dispense: 8 g; Refill: 0 - benzonatate  (TESSALON ) 200 MG capsule; Take 1 capsule (200 mg total) by mouth 2 (two) times daily as needed for cough.  Dispense: 20 capsule; Refill: 0   Follow Up Instructions: I discussed the assessment and treatment plan with the patient. The patient was provided an opportunity to ask questions and all were answered. The patient agreed with the plan and demonstrated an understanding of the instructions.  A copy of instructions were sent to the patient via MyChart unless otherwise noted below.    The patient was advised to call back or seek an in-person evaluation if the symptoms worsen or if the condition fails to improve as anticipated.    Jafari Mckillop W Beckett Hickmon, NP

## 2023-12-12 NOTE — Patient Instructions (Signed)
 Dorothy Whitaker, thank you for joining Haze LELON Servant, NP for today's virtual visit.  While this provider is not your primary care provider (PCP), if your PCP is located in our provider database this encounter information will be shared with them immediately following your visit.   A [facility] MyChart account gives you access to today's visit and all your visits, tests, and labs performed at [facility]  click here if you don't have a [facility] MyChart account or go to [email or web address]  Consent: (Patient) Dorothy Whitaker provided verbal consent for this virtual visit at the beginning of the encounter.  Current Medications:  Current Outpatient Medications:    albuterol  (VENTOLIN  HFA) 108 (90 Base) MCG/ACT inhaler, Inhale 2 puffs into the lungs every 6 (six) hours as needed for wheezing or shortness of breath., Disp: 8 g, Rfl: 0   benzonatate  (TESSALON ) 200 MG capsule, Take 1 capsule (200 mg total) by mouth 2 (two) times daily as needed for cough., Disp: 20 capsule, Rfl: 0   brompheniramine-pseudoephedrine-DM 30-2-10 MG/5ML syrup, Take 5 mLs by mouth 4 (four) times daily as needed., Disp: 240 mL, Rfl: 0   metroNIDAZOLE  (METROGEL ) 0.75 % vaginal gel, Place 1 Applicatorful vaginally at bedtime for 5 days., Disp: 50 g, Rfl: 0   azelastine  (ASTELIN ) 0.1 % nasal spray, Place 1 spray into both nostrils 2 (two) times daily. Use in each nostril as directed, Disp: 30 mL, Rfl: 0   Blood Glucose Monitoring Suppl DEVI, 1 each by Does not apply route in the morning, at noon, and at bedtime. May substitute to any manufacturer covered by patient's insurance., Disp: 1 each, Rfl: 0   clonazePAM  (KLONOPIN ) 0.5 MG tablet, Take 1 tablet (0.5 mg total) by mouth 2 (two) times daily as needed for anxiety. (Patient not taking: Reported on 12/04/2023), Disp: 20 tablet, Rfl: 0   ferrous sulfate  300 (60 Fe) MG/5ML syrup, Take 5 mLs (300 mg total) by mouth daily., Disp: 500 mL, Rfl: 0    fluconazole  (DIFLUCAN ) 150 MG tablet, Take 1 tablet (150 mg total) by mouth every 3 (three) days., Disp: 2 tablet, Rfl: 0   FLUoxetine  (PROZAC ) 20 MG capsule, Take 1 capsule (20 mg total) by mouth daily. (Patient not taking: Reported on 12/04/2023), Disp: 30 capsule, Rfl: 0   hydrOXYzine  (ATARAX ) 10 MG tablet, Take 1 tablet (10 mg total) by mouth 3 (three) times daily as needed for anxiety. (Patient not taking: Reported on 12/04/2023), Disp: 30 tablet, Rfl: 0   levocetirizine (XYZAL ) 5 MG tablet, Take 1 tablet (5 mg total) by mouth every evening. (Patient not taking: Reported on 12/04/2023), Disp: 30 tablet, Rfl: 0   metFORMIN  (GLUCOPHAGE ) 500 MG tablet, Take 1 tablet (500 mg total) by mouth daily with breakfast. (Patient not taking: Reported on 12/04/2023), Disp: 30 tablet, Rfl: 1   phenazopyridine  (PYRIDIUM ) 100 MG tablet, Take 1 tablet (100 mg total) by mouth 3 (three) times daily as needed for pain. (Patient not taking: Reported on 12/04/2023), Disp: 10 tablet, Rfl: 0   Vitamin D , Ergocalciferol , (DRISDOL ) 1.25 MG (50000 UNIT) CAPS capsule, Take 1 capsule (50,000 Units total) by mouth every 7 (seven) days. (Patient not taking: Reported on 08/06/2022), Disp: 12 capsule, Rfl: 0   Medications ordered in this encounter:  Meds ordered this encounter  Medications   brompheniramine-pseudoephedrine-DM 30-2-10 MG/5ML syrup    Sig: Take 5 mLs by mouth 4 (four) times daily as needed.    Dispense:  240 mL    Refill:  0    Supervising Provider:   LAMPTEY, PHILIP O [[id number]]   albuterol  (VENTOLIN  HFA) 108 (90 Base) MCG/ACT inhaler    Sig: Inhale 2 puffs into the lungs every 6 (six) hours as needed for wheezing or shortness of breath.    Dispense:  8 g    Refill:  0    Supervising Provider:   BLAISE ALEENE KIDD [[id number]]   benzonatate  (TESSALON ) 200 MG capsule    Sig: Take 1 capsule (200 mg total) by mouth 2 (two) times daily as needed for cough.    Dispense:  20 capsule    Refill:  0    Supervising Provider:    BLAISE ALEENE KIDD [[id number]]     *If you need refills on other medications prior to your next appointment, please contact your pharmacy*  Follow-Up: Call back or seek an in-person evaluation if the symptoms worsen or if the condition fails to improve as anticipated.  [facility] [phone]    If you have been instructed to have an in-person evaluation today at a local Urgent Care facility, please use the link below. It will take you to a list of all of our available [facility], including address, phone number and hours of operation. Please do not delay care.  [facility]  If you or a family member do not have a primary care provider, use the link below to schedule a visit and establish care. When you choose a [facility] primary care physician or advanced practice provider, you gain a long-term partner in health. Find a Primary Care Provider  Learn more about [facility]'s in-office and virtual care options: [facility] - Get Care Now

## 2023-12-13 ENCOUNTER — Telehealth: Payer: Self-pay | Admitting: Family

## 2023-12-13 DIAGNOSIS — J4531 Mild persistent asthma with (acute) exacerbation: Secondary | ICD-10-CM

## 2023-12-13 MED ORDER — PROMETHAZINE-DM 6.25-15 MG/5ML PO SYRP: 5.0000 mL | ORAL_SOLUTION | Freq: Three times a day (TID) | ORAL | 0 refills | Status: AC | PRN

## 2023-12-13 MED ORDER — PREDNISONE 10 MG (21) PO TBPK: ORAL_TABLET | ORAL | 0 refills | Status: AC

## 2023-12-13 NOTE — Progress Notes (Signed)
 Virtual Visit Consent   Dorothy Whitaker, you are scheduled for a virtual visit with a [facility] provider today. Just as with appointments in the office, your consent must be obtained to participate. Your consent will be active for this visit and any virtual visit you may have with one of our providers in the next 365 days. If you have a MyChart account, a copy of this consent can be sent to you electronically.  As this is a virtual visit, video technology does not allow for your provider to perform a traditional examination. This may limit your provider's ability to fully assess your condition. If your provider identifies any concerns that need to be evaluated in person or the need to arrange testing (such as labs, EKG, etc.), we will make arrangements to do so. Although advances in technology are sophisticated, we cannot ensure that it will always work on either your end or our end. If the connection with a video visit is poor, the visit may have to be switched to a telephone visit. With either a video or telephone visit, we are not always able to ensure that we have a secure connection.  By engaging in this virtual visit, you consent to the provision of healthcare and authorize for your insurance to be billed (if applicable) for the services provided during this visit. Depending on your insurance coverage, you may receive a charge related to this service.  I need to obtain your verbal consent now. Are you willing to proceed with your visit today? Dorothy Whitaker has provided verbal consent on 12/13/2023 for a virtual visit (video or telephone). Bari Learn, FNP  Date: 12/13/2023 6:22 PM  Virtual Visit via Video Note   I, Bari Learn, connected with  Dorothy Whitaker  ([id number], [birth year]) on 12/13/23 at  6:15 PM EST by a video-enabled telemedicine application and verified that I am speaking with the correct person using two identifiers.  Location: Patient: Virtual Visit Location Patient:  Home Provider: Virtual Visit Location Provider: Home Office   I discussed the limitations of evaluation and management by telemedicine and the availability of in person appointments. The patient expressed understanding and agreed to proceed.    History of Present Illness: Dorothy Whitaker is a 28 y.o. who identifies as a female who was assigned female at birth, and is being seen today for cough that is worsening. Reports she can not stop coughing. Had a video visit yesterday albuterol , brompheniramine-pseudoephedrine-DM, and tessalon  that has not helped.   HPI: Cough This is a new problem. The current episode started in the past 7 days. The problem has been gradually worsening. The problem occurs every few minutes. The cough is Non-productive. Associated symptoms include a fever, headaches, myalgias, nasal congestion, postnasal drip and wheezing. Pertinent negatives include no ear congestion, ear pain, sore throat or shortness of breath. She has tried OTC cough suppressant and rest for the symptoms. The treatment provided mild relief.    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   Panic attack 06/26/2021   Symptomatic anemia 11/15/2019   Heavy menstrual bleeding 11/15/2019   Asthma    Acanthosis nigricans 09/21/2015   Body mass index (BMI) of 36.0-36.9 in adult 07/17/2015    Allergies: No Known Allergies Medications:  Current Outpatient Medications:    predniSONE  (STERAPRED UNI-PAK 21 TAB) 10 MG (21) TBPK tablet, Use as directed, Disp: 21 tablet, Rfl:  0   promethazine -dextromethorphan (PROMETHAZINE -DM) 6.25-15 MG/5ML syrup, Take 5 mLs by mouth 3 (three) times daily as needed for cough., Disp: 118 mL, Rfl: 0   albuterol  (VENTOLIN  HFA) 108 (90 Base) MCG/ACT inhaler, Inhale 2 puffs into the lungs every 6 (six) hours as needed for wheezing or shortness of breath., Disp: 8 g, Rfl: 0   azelastine   (ASTELIN ) 0.1 % nasal spray, Place 1 spray into both nostrils 2 (two) times daily. Use in each nostril as directed, Disp: 30 mL, Rfl: 0   benzonatate  (TESSALON ) 200 MG capsule, Take 1 capsule (200 mg total) by mouth 2 (two) times daily as needed for cough., Disp: 20 capsule, Rfl: 0   Blood Glucose Monitoring Suppl DEVI, 1 each by Does not apply route in the morning, at noon, and at bedtime. May substitute to any manufacturer covered by patient's insurance., Disp: 1 each, Rfl: 0   brompheniramine-pseudoephedrine-DM 30-2-10 MG/5ML syrup, Take 5 mLs by mouth 4 (four) times daily as needed., Disp: 240 mL, Rfl: 0   clonazePAM  (KLONOPIN ) 0.5 MG tablet, Take 1 tablet (0.5 mg total) by mouth 2 (two) times daily as needed for anxiety. (Patient not taking: Reported on 12/04/2023), Disp: 20 tablet, Rfl: 0   ferrous sulfate  300 (60 Fe) MG/5ML syrup, Take 5 mLs (300 mg total) by mouth daily., Disp: 500 mL, Rfl: 0   fluconazole  (DIFLUCAN ) 150 MG tablet, Take 1 tablet (150 mg total) by mouth every 3 (three) days., Disp: 2 tablet, Rfl: 0   FLUoxetine  (PROZAC ) 20 MG capsule, Take 1 capsule (20 mg total) by mouth daily. (Patient not taking: Reported on 12/04/2023), Disp: 30 capsule, Rfl: 0   hydrOXYzine  (ATARAX ) 10 MG tablet, Take 1 tablet (10 mg total) by mouth 3 (three) times daily as needed for anxiety. (Patient not taking: Reported on 12/04/2023), Disp: 30 tablet, Rfl: 0   levocetirizine (XYZAL ) 5 MG tablet, Take 1 tablet (5 mg total) by mouth every evening. (Patient not taking: Reported on 12/04/2023), Disp: 30 tablet, Rfl: 0   metFORMIN  (GLUCOPHAGE ) 500 MG tablet, Take 1 tablet (500 mg total) by mouth daily with breakfast. (Patient not taking: Reported on 12/04/2023), Disp: 30 tablet, Rfl: 1   phenazopyridine  (PYRIDIUM ) 100 MG tablet, Take 1 tablet (100 mg total) by mouth 3 (three) times daily as needed for pain. (Patient not taking: Reported on 12/04/2023), Disp: 10 tablet, Rfl: 0   Vitamin D , Ergocalciferol , (DRISDOL )  1.25 MG (50000 UNIT) CAPS capsule, Take 1 capsule (50,000 Units total) by mouth every 7 (seven) days. (Patient not taking: Reported on 08/06/2022), Disp: 12 capsule, Rfl: 0  Observations/Objective: Patient is well-developed, well-nourished in no acute distress.  Resting comfortably  at home.  Head is normocephalic, atraumatic.  No labored breathing.  Speech is clear and coherent with logical content.  Patient is alert and oriented at baseline.  Tight constant nonproductive cough  Assessment and Plan: 1. Mild persistent asthma with exacerbation (Primary) - predniSONE  (STERAPRED UNI-PAK 21 TAB) 10 MG (21) TBPK tablet; Use as directed  Dispense: 21 tablet; Refill: 0 - promethazine -dextromethorphan (PROMETHAZINE -DM) 6.25-15 MG/5ML syrup; Take 5 mLs by mouth 3 (three) times daily as needed for cough.  Dispense: 118 mL; Refill: 0  Start prednisone   Start promethazine -dm Continue other medications  Follow up if symptoms worsen or do not improve   Follow Up Instructions: I discussed the assessment and treatment plan with the patient. The patient was provided an opportunity to ask questions and all were answered. The patient agreed with the plan  and demonstrated an understanding of the instructions.  A copy of instructions were sent to the patient via MyChart unless otherwise noted below.     The patient was advised to call back or seek an in-person evaluation if the symptoms worsen or if the condition fails to improve as anticipated.    Bari Learn, FNP

## 2023-12-29 ENCOUNTER — Other Ambulatory Visit: Payer: Self-pay | Admitting: Nurse Practitioner

## 2023-12-29 DIAGNOSIS — J4520 Mild intermittent asthma, uncomplicated: Secondary | ICD-10-CM

## 2024-01-23 ENCOUNTER — Telehealth: Payer: Self-pay

## 2024-01-24 ENCOUNTER — Telehealth: Payer: Self-pay | Admitting: Physician Assistant

## 2024-01-24 DIAGNOSIS — B9689 Other specified bacterial agents as the cause of diseases classified elsewhere: Secondary | ICD-10-CM

## 2024-01-24 DIAGNOSIS — N760 Acute vaginitis: Secondary | ICD-10-CM

## 2024-01-24 MED ORDER — METRONIDAZOLE 500 MG PO TABS: 500.0000 mg | ORAL_TABLET | Freq: Two times a day (BID) | ORAL | 0 refills | Status: AC

## 2024-01-24 NOTE — Progress Notes (Signed)
 Virtual Visit Consent   Dorothy Whitaker, you are scheduled for a virtual visit with a [facility] provider today. Just as with appointments in the office, your consent must be obtained to participate. Your consent will be active for this visit and any virtual visit you may have with one of our providers in the next 365 days. If you have a MyChart account, a copy of this consent can be sent to you electronically.  As this is a virtual visit, video technology does not allow for your provider to perform a traditional examination. This may limit your provider's ability to fully assess your condition. If your provider identifies any concerns that need to be evaluated in person or the need to arrange testing (such as labs, EKG, etc.), we will make arrangements to do so. Although advances in technology are sophisticated, we cannot ensure that it will always work on either your end or our end. If the connection with a video visit is poor, the visit may have to be switched to a telephone visit. With either a video or telephone visit, we are not always able to ensure that we have a secure connection.  By engaging in this virtual visit, you consent to the provision of healthcare and authorize for your insurance to be billed (if applicable) for the services provided during this visit. Depending on your insurance coverage, you may receive a charge related to this service.  I need to obtain your verbal consent now. Are you willing to proceed with your visit today? Dorothy Whitaker has provided verbal consent on 01/24/2024 for a virtual visit (video or telephone). Tylene Fantasia Ward, PA-C  Date: 01/24/2024 10:20 AM   Virtual Visit via Video Note   I, Tylene Fantasia Ward, connected with  Dorothy Whitaker  ([id number], 1996/09/14) on 01/24/24 at 10:15 AM EDT by a video-enabled telemedicine application and verified that I am speaking with the correct person using two identifiers.  Location: Patient: Virtual Visit Location  Patient: Home Provider: Virtual Visit Location Provider: Home Office   I discussed the limitations of evaluation and management by telemedicine and the availability of in person appointments. The patient expressed understanding and agreed to proceed.    History of Present Illness: Dorothy Whitaker is a 28 y.o. who identifies as a female who was assigned female at birth, and is being seen today for increased vaginal discharge with foul odor that started several days ago.  Pt reports today's symptoms are similar to previous BV which resolved with flagyl.  Denies abdominal pain, pelvic pain, fever chills.  HPI: HPI  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   Panic attack 06/26/2021   Symptomatic anemia 11/15/2019   Heavy menstrual bleeding 11/15/2019   Asthma    Acanthosis nigricans 09/21/2015   Body mass index (BMI) of 36.0-36.9 in adult 07/17/2015    Allergies: No Known Allergies Medications:  Current Outpatient Medications:    metroNIDAZOLE (FLAGYL) 500 MG tablet, Take 1 tablet (500 mg total) by mouth 2 (two) times daily for 7 days., Disp: 14 tablet, Rfl: 0   albuterol (VENTOLIN HFA) 108 (90 Base) MCG/ACT inhaler, Inhale 2 puffs into the lungs every 6 (six) hours as needed for wheezing or shortness of breath., Disp: 8 g, Rfl: 0   azelastine (ASTELIN) 0.1 % nasal spray, Place 1 spray into both nostrils 2 (two) times daily. Use in each nostril as  directed, Disp: 30 mL, Rfl: 0   benzonatate (TESSALON) 200 MG capsule, Take 1 capsule (200 mg total) by mouth 2 (two) times daily as needed for cough., Disp: 20 capsule, Rfl: 0   Blood Glucose Monitoring Suppl DEVI, 1 each by Does not apply route in the morning, at noon, and at bedtime. May substitute to any manufacturer covered by patient's insurance., Disp: 1 each, Rfl: 0   brompheniramine-pseudoephedrine-DM 30-2-10 MG/5ML syrup, Take 5 mLs by  mouth 4 (four) times daily as needed., Disp: 240 mL, Rfl: 0   clonazePAM (KLONOPIN) 0.5 MG tablet, Take 1 tablet (0.5 mg total) by mouth 2 (two) times daily as needed for anxiety. (Patient not taking: Reported on 12/04/2023), Disp: 20 tablet, Rfl: 0   ferrous sulfate 300 (60 Fe) MG/5ML syrup, Take 5 mLs (300 mg total) by mouth daily., Disp: 500 mL, Rfl: 0   fluconazole (DIFLUCAN) 150 MG tablet, Take 1 tablet (150 mg total) by mouth every 3 (three) days., Disp: 2 tablet, Rfl: 0   FLUoxetine (PROZAC) 20 MG capsule, Take 1 capsule (20 mg total) by mouth daily. (Patient not taking: Reported on 12/04/2023), Disp: 30 capsule, Rfl: 0   hydrOXYzine (ATARAX) 10 MG tablet, Take 1 tablet (10 mg total) by mouth 3 (three) times daily as needed for anxiety. (Patient not taking: Reported on 12/04/2023), Disp: 30 tablet, Rfl: 0   levocetirizine (XYZAL) 5 MG tablet, Take 1 tablet (5 mg total) by mouth every evening. (Patient not taking: Reported on 12/04/2023), Disp: 30 tablet, Rfl: 0   metFORMIN (GLUCOPHAGE) 500 MG tablet, Take 1 tablet (500 mg total) by mouth daily with breakfast. (Patient not taking: Reported on 12/04/2023), Disp: 30 tablet, Rfl: 1   phenazopyridine (PYRIDIUM) 100 MG tablet, Take 1 tablet (100 mg total) by mouth 3 (three) times daily as needed for pain. (Patient not taking: Reported on 12/04/2023), Disp: 10 tablet, Rfl: 0   predniSONE (STERAPRED UNI-PAK 21 TAB) 10 MG (21) TBPK tablet, Use as directed, Disp: 21 tablet, Rfl: 0   promethazine-dextromethorphan (PROMETHAZINE-DM) 6.25-15 MG/5ML syrup, Take 5 mLs by mouth 3 (three) times daily as needed for cough., Disp: 118 mL, Rfl: 0   Vitamin D, Ergocalciferol, (DRISDOL) 1.25 MG (50000 UNIT) CAPS capsule, Take 1 capsule (50,000 Units total) by mouth every 7 (seven) days. (Patient not taking: Reported on 08/06/2022), Disp: 12 capsule, Rfl: 0  Observations/Objective: Patient is well-developed, well-nourished in no acute distress.  Resting comfortably at home.   Head is normocephalic, atraumatic.  No labored breathing.  Speech is clear and coherent with logical content.  Patient is alert and oriented at baseline.    Assessment and Plan: 1. Bacterial vaginitis (Primary) - metroNIDAZOLE (FLAGYL) 500 MG tablet; Take 1 tablet (500 mg total) by mouth 2 (two) times daily for 7 days.  Dispense: 14 tablet; Refill: 0  Advised in person evaluation if no improvement.  Sx consistent with BV.   Follow Up Instructions: I discussed the assessment and treatment plan with the patient. The patient was provided an opportunity to ask questions and all were answered. The patient agreed with the plan and demonstrated an understanding of the instructions.  A copy of instructions were sent to the patient via MyChart unless otherwise noted below.     The patient was advised to call back or seek an in-person evaluation if the symptoms worsen or if the condition fails to improve as anticipated.    Tylene Fantasia Ward, PA-C

## 2024-01-24 NOTE — Patient Instructions (Signed)
 Dorothy Whitaker, thank you for joining Tylene Fantasia Ward, PA-C for today's virtual visit.  While this provider is not your primary care provider (PCP), if your PCP is located in our provider database this encounter information will be shared with them immediately following your visit.   A [facility] MyChart account gives you access to today's visit and all your visits, tests, and labs performed at [facility] " click here if you don't have a [facility] MyChart account or go to [email or web address]  Consent: (Patient) Dorothy Whitaker provided verbal consent for this virtual visit at the beginning of the encounter.  Current Medications:  Current Outpatient Medications:    metroNIDAZOLE (FLAGYL) 500 MG tablet, Take 1 tablet (500 mg total) by mouth 2 (two) times daily for 7 days., Disp: 14 tablet, Rfl: 0   albuterol (VENTOLIN HFA) 108 (90 Base) MCG/ACT inhaler, Inhale 2 puffs into the lungs every 6 (six) hours as needed for wheezing or shortness of breath., Disp: 8 g, Rfl: 0   azelastine (ASTELIN) 0.1 % nasal spray, Place 1 spray into both nostrils 2 (two) times daily. Use in each nostril as directed, Disp: 30 mL, Rfl: 0   benzonatate (TESSALON) 200 MG capsule, Take 1 capsule (200 mg total) by mouth 2 (two) times daily as needed for cough., Disp: 20 capsule, Rfl: 0   Blood Glucose Monitoring Suppl DEVI, 1 each by Does not apply route in the morning, at noon, and at bedtime. May substitute to any manufacturer covered by patient's insurance., Disp: 1 each, Rfl: 0   brompheniramine-pseudoephedrine-DM 30-2-10 MG/5ML syrup, Take 5 mLs by mouth 4 (four) times daily as needed., Disp: 240 mL, Rfl: 0   clonazePAM (KLONOPIN) 0.5 MG tablet, Take 1 tablet (0.5 mg total) by mouth 2 (two) times daily as needed for anxiety. (Patient not taking: Reported on 12/04/2023), Disp: 20 tablet, Rfl: 0   ferrous sulfate 300 (60 Fe) MG/5ML syrup, Take 5 mLs (300 mg total) by mouth daily., Disp: 500 mL, Rfl:  0   fluconazole (DIFLUCAN) 150 MG tablet, Take 1 tablet (150 mg total) by mouth every 3 (three) days., Disp: 2 tablet, Rfl: 0   FLUoxetine (PROZAC) 20 MG capsule, Take 1 capsule (20 mg total) by mouth daily. (Patient not taking: Reported on 12/04/2023), Disp: 30 capsule, Rfl: 0   hydrOXYzine (ATARAX) 10 MG tablet, Take 1 tablet (10 mg total) by mouth 3 (three) times daily as needed for anxiety. (Patient not taking: Reported on 12/04/2023), Disp: 30 tablet, Rfl: 0   levocetirizine (XYZAL) 5 MG tablet, Take 1 tablet (5 mg total) by mouth every evening. (Patient not taking: Reported on 12/04/2023), Disp: 30 tablet, Rfl: 0   metFORMIN (GLUCOPHAGE) 500 MG tablet, Take 1 tablet (500 mg total) by mouth daily with breakfast. (Patient not taking: Reported on 12/04/2023), Disp: 30 tablet, Rfl: 1   phenazopyridine (PYRIDIUM) 100 MG tablet, Take 1 tablet (100 mg total) by mouth 3 (three) times daily as needed for pain. (Patient not taking: Reported on 12/04/2023), Disp: 10 tablet, Rfl: 0   predniSONE (STERAPRED UNI-PAK 21 TAB) 10 MG (21) TBPK tablet, Use as directed, Disp: 21 tablet, Rfl: 0   promethazine-dextromethorphan (PROMETHAZINE-DM) 6.25-15 MG/5ML syrup, Take 5 mLs by mouth 3 (three) times daily as needed for cough., Disp: 118 mL, Rfl: 0   Vitamin D, Ergocalciferol, (DRISDOL) 1.25 MG (50000 UNIT) CAPS capsule, Take 1 capsule (50,000 Units total) by mouth every 7 (seven) days. (Patient not taking: Reported on 08/06/2022), Disp: 12  capsule, Rfl: 0   Medications ordered in this encounter:  Meds ordered this encounter  Medications   metroNIDAZOLE (FLAGYL) 500 MG tablet    Sig: Take 1 tablet (500 mg total) by mouth 2 (two) times daily for 7 days.    Dispense:  14 tablet    Refill:  0    Supervising Provider:   Merrilee Jansky [[id number]]     *If you need refills on other medications prior to your next appointment, please contact your pharmacy*  Follow-Up: Call back or seek an in-person evaluation if the  symptoms worsen or if the condition fails to improve as anticipated.  [facility] [phone]  Other Instructions Take medication as prescribed.  If no improvement please be seen in person for further testing.    If you have been instructed to have an in-person evaluation today at a local Urgent Care facility, please use the link below. It will take you to a list of all of our available [facility], including address, phone number and hours of operation. Please do not delay care.  [facility]  If you or a family member do not have a primary care provider, use the link below to schedule a visit and establish care. When you choose a [facility] primary care physician or advanced practice provider, you gain a long-term partner in health. Find a Primary Care Provider  Learn more about [facility]'s in-office and virtual care options: [facility] - Get Care Now

## 2024-01-28 ENCOUNTER — Telehealth: Payer: Self-pay

## 2024-01-29 ENCOUNTER — Telehealth: Payer: Self-pay | Admitting: Physician Assistant

## 2024-01-29 ENCOUNTER — Telehealth: Payer: Self-pay

## 2024-01-29 DIAGNOSIS — B3731 Acute candidiasis of vulva and vagina: Secondary | ICD-10-CM

## 2024-01-29 MED ORDER — NYSTATIN 100000 UNIT/GM EX CREA: 1.0000 | TOPICAL_CREAM | Freq: Two times a day (BID) | CUTANEOUS | 0 refills | Status: AC

## 2024-01-29 MED ORDER — FLUCONAZOLE 150 MG PO TABS: ORAL_TABLET | ORAL | 0 refills | Status: DC

## 2024-01-29 NOTE — Progress Notes (Signed)
 Ms. Dorothy Whitaker, Dorothy Whitaker are scheduled for a virtual visit with your provider today.    Just as we do with appointments in the office, we must obtain your consent to participate.  Your consent will be active for this visit and any virtual visit you may have with one of our providers in the next 365 days.    If you have a MyChart account, I can also send a copy of this consent to you electronically.  All virtual visits are billed to your insurance company just like a traditional visit in the office.  As this is a virtual visit, video technology does not allow for your provider to perform a traditional examination.  This may limit your provider's ability to fully assess your condition.  If your provider identifies any concerns that need to be evaluated in person or the need to arrange testing such as labs, EKG, etc, we will make arrangements to do so.    Although advances in technology are sophisticated, we cannot ensure that it will always work on either your end or our end.  If the connection with a video visit is poor, we may have to switch to a telephone visit.  With either a video or telephone visit, we are not always able to ensure that we have a secure connection.   I need to obtain your verbal consent now.   Are you willing to proceed with your visit today?   Dorothy Whitaker has provided verbal consent on 01/29/2024 for a virtual visit (video or telephone).   Karrie Meres, [place] 01/29/2024  8:35 AM   Date:  01/29/2024   ID:  Dorothy Whitaker, DOB [birth year], MRN [id number]  Patient Location: Home Provider Location: Home Office   Participants: Patient and Provider for Visit and Wrap up  Method of visit: Video  Location of Patient: Home Location of Provider: Home Office Consent was obtain for visit over the video. Services rendered by provider: Visit was performed via video  A video enabled telemedicine application was used and I verified that I am speaking with the correct person using  two identifiers.  PCP:  Pcp, No   Chief Complaint:  yeast infection  History of Present Illness:    Dorothy Whitaker is a 28 y.o. female with history as stated below. Presents video telehealth for an acute care visit  Pt states she recently took antibiotics for bacterial infection and now she is concerned for a yeast infection.  She reports some vaginal itching and states that the skin to her vagina is raw. Sxs started a few days ago. She denies and vaginal discharge. States she just ended her menstrual cycle yesterday.   States that her symptoms feel similar to when she has had a yeast infection in the past.   Past Medical, Surgical, Social History, Allergies, and Medications have been Reviewed.  Past Medical History:  Diagnosis Date   Anemia    Asthma    Encounter for blood transfusion 2021    Current Meds  Medication Sig   nystatin cream (MYCOSTATIN) Apply 1 Application topically 2 (two) times daily for 15 days.   [DISCONTINUED] fluconazole (DIFLUCAN) 150 MG tablet Take one tablet on the day you fill the prescription. If you continue to have symptoms then take the second tablet in 3 days.     Allergies:   Patient has no known allergies.   ROS See HPI for history of present illness.  Physical Exam Constitutional:      General:  She is not in acute distress.    Appearance: Normal appearance. She is not ill-appearing.  Neurological:     Mental Status: She is alert.               MDM: Pt with vaginal itching and irritaiton after taking abx. Suspect yeast infection, hx same. Rx for fluconazole and nystatin sent.   Tests Ordered: No orders of the defined types were placed in this encounter.   Medication Changes: Meds ordered this encounter  Medications   DISCONTD: fluconazole (DIFLUCAN) 150 MG tablet    Sig: Take one tablet on the day you fill the prescription. If you continue to have symptoms then take the second tablet in 3 days.    Dispense:  2 tablet     Refill:  0   nystatin cream (MYCOSTATIN)    Sig: Apply 1 Application topically 2 (two) times daily for 15 days.    Dispense:  30 g    Refill:  0   fluconazole (DIFLUCAN) 150 MG tablet    Sig: Take one tablet on the day you fill the prescription. If you continue to have symptoms then take the second tablet in 3 days.    Dispense:  2 tablet    Refill:  0     Disposition:  Follow up  Signed, Karrie Meres, PA-C  01/29/2024 8:35 AM

## 2024-01-29 NOTE — Patient Instructions (Signed)
 Dorothy Whitaker, thank you for joining Karrie Meres, PA-C for today's virtual visit.  While this provider is not your primary care provider (PCP), if your PCP is located in our provider database this encounter information will be shared with them immediately following your visit.   A [place] MyChart account gives you access to today's visit and all your visits, tests, and labs performed at [facility] " click here if you don't have a [place] MyChart account or go to [email or web address]  Consent: (Patient) Dorothy Whitaker provided verbal consent for this virtual visit at the beginning of the encounter.  Current Medications:  Current Outpatient Medications:    albuterol (VENTOLIN HFA) 108 (90 Base) MCG/ACT inhaler, Inhale 2 puffs into the lungs every 6 (six) hours as needed for wheezing or shortness of breath., Disp: 8 g, Rfl: 0   azelastine (ASTELIN) 0.1 % nasal spray, Place 1 spray into both nostrils 2 (two) times daily. Use in each nostril as directed, Disp: 30 mL, Rfl: 0   benzonatate (TESSALON) 200 MG capsule, Take 1 capsule (200 mg total) by mouth 2 (two) times daily as needed for cough., Disp: 20 capsule, Rfl: 0   Blood Glucose Monitoring Suppl DEVI, 1 each by Does not apply route in the morning, at noon, and at bedtime. May substitute to any manufacturer covered by patient's insurance., Disp: 1 each, Rfl: 0   brompheniramine-pseudoephedrine-DM 30-2-10 MG/5ML syrup, Take 5 mLs by mouth 4 (four) times daily as needed., Disp: 240 mL, Rfl: 0   clonazePAM (KLONOPIN) 0.5 MG tablet, Take 1 tablet (0.5 mg total) by mouth 2 (two) times daily as needed for anxiety. (Patient not taking: Reported on 12/04/2023), Disp: 20 tablet, Rfl: 0   ferrous sulfate 300 (60 Fe) MG/5ML syrup, Take 5 mLs (300 mg total) by mouth daily., Disp: 500 mL, Rfl: 0   fluconazole (DIFLUCAN) 150 MG tablet, Take 1 tablet (150 mg total) by mouth every 3 (three) days., Disp: 2 tablet, Rfl: 0    FLUoxetine (PROZAC) 20 MG capsule, Take 1 capsule (20 mg total) by mouth daily. (Patient not taking: Reported on 12/04/2023), Disp: 30 capsule, Rfl: 0   hydrOXYzine (ATARAX) 10 MG tablet, Take 1 tablet (10 mg total) by mouth 3 (three) times daily as needed for anxiety. (Patient not taking: Reported on 12/04/2023), Disp: 30 tablet, Rfl: 0   levocetirizine (XYZAL) 5 MG tablet, Take 1 tablet (5 mg total) by mouth every evening. (Patient not taking: Reported on 12/04/2023), Disp: 30 tablet, Rfl: 0   metFORMIN (GLUCOPHAGE) 500 MG tablet, Take 1 tablet (500 mg total) by mouth daily with breakfast. (Patient not taking: Reported on 12/04/2023), Disp: 30 tablet, Rfl: 1   metroNIDAZOLE (FLAGYL) 500 MG tablet, Take 1 tablet (500 mg total) by mouth 2 (two) times daily for 7 days., Disp: 14 tablet, Rfl: 0   phenazopyridine (PYRIDIUM) 100 MG tablet, Take 1 tablet (100 mg total) by mouth 3 (three) times daily as needed for pain. (Patient not taking: Reported on 12/04/2023), Disp: 10 tablet, Rfl: 0   predniSONE (STERAPRED UNI-PAK 21 TAB) 10 MG (21) TBPK tablet, Use as directed, Disp: 21 tablet, Rfl: 0   promethazine-dextromethorphan (PROMETHAZINE-DM) 6.25-15 MG/5ML syrup, Take 5 mLs by mouth 3 (three) times daily as needed for cough., Disp: 118 mL, Rfl: 0   Vitamin D, Ergocalciferol, (DRISDOL) 1.25 MG (50000 UNIT) CAPS capsule, Take 1 capsule (50,000 Units total) by mouth every 7 (seven) days. (Patient not taking: Reported on 08/06/2022), Disp: 12  capsule, Rfl: 0   Medications ordered in this encounter:  No orders of the defined types were placed in this encounter.    *If you need refills on other medications prior to your next appointment, please contact your pharmacy*  Follow-Up: Call back or seek an in-person evaluation if the symptoms worsen or if the condition fails to improve as anticipated.  [facility] [phone]  Other Instructions Take the fluconazole and use the nystatin cream as  prescribed   Follow up with your regular doctor in 1 week for reassessment and seek care sooner if your symptoms worsen or fail to improve.    If you have been instructed to have an in-person evaluation today at a local Urgent Care facility, please use the link below. It will take you to a list of all of our available [place] [facility], including address, phone number and hours of operation. Please do not delay care.  [facility]  If you or a family member do not have a primary care provider, use the link below to schedule a visit and establish care. When you choose a [place] primary care physician or advanced practice provider, you gain a long-term partner in health. Find a Primary Care Provider  Learn more about [facility]'s in-office and virtual care options: [facility] - Get Care Now

## 2024-01-29 NOTE — Addendum Note (Signed)
 Addended by: Karrie Meres on: 01/29/2024 09:40 AM   Modules accepted: Level of Service

## 2024-02-16 ENCOUNTER — Telehealth: Payer: Self-pay | Admitting: Physician Assistant

## 2024-02-16 DIAGNOSIS — N760 Acute vaginitis: Secondary | ICD-10-CM

## 2024-02-16 MED ORDER — METRONIDAZOLE 0.75 % VA GEL: 1.0000 | Freq: Every day | VAGINAL | 0 refills | Status: AC

## 2024-02-16 NOTE — Patient Instructions (Signed)
 Dorothy Whitaker, thank you for joining Hyla Maillard, PA-C for today's virtual visit.  While this provider is not your primary care provider (PCP), if your PCP is located in our provider database this encounter information will be shared with them immediately following your visit.   A [place] MyChart account gives you access to today's visit and all your visits, tests, and labs performed at [facility] " click here if you don't have a [place] MyChart account or go to [email or web address]  Consent: (Patient) Dorothy Whitaker provided verbal consent for this virtual visit at the beginning of the encounter.  Current Medications:  Current Outpatient Medications:    albuterol (VENTOLIN HFA) 108 (90 Base) MCG/ACT inhaler, Inhale 2 puffs into the lungs every 6 (six) hours as needed for wheezing or shortness of breath., Disp: 8 g, Rfl: 0   azelastine (ASTELIN) 0.1 % nasal spray, Place 1 spray into both nostrils 2 (two) times daily. Use in each nostril as directed, Disp: 30 mL, Rfl: 0   benzonatate (TESSALON) 200 MG capsule, Take 1 capsule (200 mg total) by mouth 2 (two) times daily as needed for cough., Disp: 20 capsule, Rfl: 0   Blood Glucose Monitoring Suppl DEVI, 1 each by Does not apply route in the morning, at noon, and at bedtime. May substitute to any manufacturer covered by patient's insurance., Disp: 1 each, Rfl: 0   brompheniramine-pseudoephedrine-DM 30-2-10 MG/5ML syrup, Take 5 mLs by mouth 4 (four) times daily as needed., Disp: 240 mL, Rfl: 0   clonazePAM (KLONOPIN) 0.5 MG tablet, Take 1 tablet (0.5 mg total) by mouth 2 (two) times daily as needed for anxiety. (Patient not taking: Reported on 12/04/2023), Disp: 20 tablet, Rfl: 0   ferrous sulfate 300 (60 Fe) MG/5ML syrup, Take 5 mLs (300 mg total) by mouth daily., Disp: 500 mL, Rfl: 0   FLUoxetine (PROZAC) 20 MG capsule, Take 1 capsule (20 mg total) by mouth daily. (Patient not taking: Reported on 12/04/2023),  Disp: 30 capsule, Rfl: 0   hydrOXYzine (ATARAX) 10 MG tablet, Take 1 tablet (10 mg total) by mouth 3 (three) times daily as needed for anxiety. (Patient not taking: Reported on 12/04/2023), Disp: 30 tablet, Rfl: 0   levocetirizine (XYZAL) 5 MG tablet, Take 1 tablet (5 mg total) by mouth every evening. (Patient not taking: Reported on 12/04/2023), Disp: 30 tablet, Rfl: 0   metFORMIN (GLUCOPHAGE) 500 MG tablet, Take 1 tablet (500 mg total) by mouth daily with breakfast. (Patient not taking: Reported on 12/04/2023), Disp: 30 tablet, Rfl: 1   phenazopyridine (PYRIDIUM) 100 MG tablet, Take 1 tablet (100 mg total) by mouth 3 (three) times daily as needed for pain. (Patient not taking: Reported on 12/04/2023), Disp: 10 tablet, Rfl: 0   predniSONE (STERAPRED UNI-PAK 21 TAB) 10 MG (21) TBPK tablet, Use as directed, Disp: 21 tablet, Rfl: 0   promethazine-dextromethorphan (PROMETHAZINE-DM) 6.25-15 MG/5ML syrup, Take 5 mLs by mouth 3 (three) times daily as needed for cough., Disp: 118 mL, Rfl: 0   Vitamin D, Ergocalciferol, (DRISDOL) 1.25 MG (50000 UNIT) CAPS capsule, Take 1 capsule (50,000 Units total) by mouth every 7 (seven) days. (Patient not taking: Reported on 08/06/2022), Disp: 12 capsule, Rfl: 0   Medications ordered in this encounter:  No orders of the defined types were placed in this encounter.    *If you need refills on other medications prior to your next appointment, please contact your pharmacy*  Follow-Up: Call back or seek an in-person evaluation if  the symptoms worsen or if the condition fails to improve as anticipated.  [facility] [phone]  Other Instructions Vaginal Infection (Bacterial Vaginosis): What to Know  Bacterial vaginosis is an infection of the vagina. It happens when the balance of normal germs (bacteria) in the vagina changes. If you don't get treated, it can make it easier for you to get other infections from sex. These are called sexually transmitted  infections (STIs). If you're pregnant, you need to get treated right away. This infection can cause a baby to be born early or at a low birth weight. What are the causes? This infection happens when too many harmful germs grow in the vagina. You can't get this infection from toilet seats, bedsheets, swimming pools, or things that touch your vagina. What increases the risk? Having sex with a new person or more than one person. Having sex without protection. Douching. Having an intrauterine device (IUD). Smoking. Using drugs or drinking alcohol. These can lead you to do risky things. Taking certain antibiotics. Being pregnant. What are the signs or symptoms? Some females have no symptoms. Symptoms may include: A gray or white discharge from your vagina. It can be watery or foamy. A fishy smell. This can happen after sex or during your menstrual period. Itching in and around your vagina. Burning or pain when you pee. How is this treated? This infection is treated with antibiotics. These may be given to you as: A pill. A cream for your vagina. A medicine that you put into your vagina (suppository). If the infection comes back, you may need more antibiotics. Follow these instructions at home: Medicines Take your medicines as told. Take or use your antibiotics as told. Do not stop using them even if you start to feel better. General instructions If the person you have sex with is a female, tell her that you have this infection. She will need to follow up with her doctor. Female partners don't need to be treated. Do not have sex until you finish treatment. Drink more fluids as told. Keep your vagina and butt clean. Wash these areas with warm water each day. Wipe from front to back after you poop. If you're breastfeeding a baby, talk to your doctor if you should keep doing so during treatment. How is this prevented? Self-care Do not douche. Do not use deodorant sprays on your  vagina. Wear cotton underwear. Do not wear tight pants and pantyhose, especially in the summer. Safe sex Use condoms the correct way and every time you have sex. Use dental dams to protect yourself during oral sex. Limit how many people you have sex with. Get tested for STIs. The person you have sex with should also get tested. Drugs and alcohol Do not smoke, vape, or use nicotine or tobacco. Do not use drugs. Limit the amount of alcohol you drink because it can lead you to do risky things. Where to find more information To learn more: Go to TonerPromos.no. Click Health Topics A-Z. Type "bacterial vaginosis" in the search bar. American Sexual Health Association (ASHA): ashasexualhealth.org U.S. Department of Health and [employer], Office on Women's Health: TravelLesson.ca Contact a doctor if: Your symptoms don't get better, even after treatment. You have more discharge or pain when you pee. You have a fever or chills. You have pain in your belly or in the area between your hips. You have pain during sex. You bleed from your vagina between menstrual periods. This information is not intended to replace  advice given to you by your health care provider. Make sure you discuss any questions you have with your health care provider. Document Revised: 04/08/2023 Document Reviewed: 04/08/2023 Elsevier Patient Education  2024 Elsevier Inc.   If you have been instructed to have an in-person evaluation today at a local Urgent Care facility, please use the link below. It will take you to a list of all of our available [facility] [facility], including address, phone number and hours of operation. Please do not delay care.  [facility]  If you or a family member do not have a primary care provider, use the link below to schedule a visit and establish care. When you choose a [facility] primary care physician or advanced practice provider, you gain a long-term partner in health. Find  a Primary Care Provider  Learn more about [facility]'s in-office and virtual care options:  - Get Care Now

## 2024-02-16 NOTE — Progress Notes (Signed)
 Virtual Visit Consent   Dorothy Whitaker, you are scheduled for a virtual visit with a [facility] provider today. Just as with appointments in the office, your consent must be obtained to participate. Your consent will be active for this visit and any virtual visit you may have with one of our providers in the next 365 days. If you have a MyChart account, a copy of this consent can be sent to you electronically.  As this is a virtual visit, video technology does not allow for your provider to perform a traditional examination. This may limit your provider's ability to fully assess your condition. If your provider identifies any concerns that need to be evaluated in person or the need to arrange testing (such as labs, EKG, etc.), we will make arrangements to do so. Although advances in technology are sophisticated, we cannot ensure that it will always work on either your end or our end. If the connection with a video visit is poor, the visit may have to be switched to a telephone visit. With either a video or telephone visit, we are not always able to ensure that we have a secure connection.  By engaging in this virtual visit, you consent to the provision of healthcare and authorize for your insurance to be billed (if applicable) for the services provided during this visit. Depending on your insurance coverage, you may receive a charge related to this service.  I need to obtain your verbal consent now. Are you willing to proceed with your visit today? LYNNIE Whitaker has provided verbal consent on 02/16/2024 for a virtual visit (video or telephone). Dorothy Whitaker, [place]  Date: 02/16/2024 6:33 PM   Virtual Visit via Video Note   I, Dorothy Whitaker, connected with  Dorothy Whitaker  ([id number], 1996-02-07) on 02/16/24 at  6:15 PM EDT by a video-enabled telemedicine application and verified that I am speaking with the correct person using two identifiers.  Location: Patient: Virtual Visit  Location Patient: Home Provider: Virtual Visit Location Provider: Home Office   I discussed the limitations of evaluation and management by telemedicine and the availability of in person appointments. The patient expressed understanding and agreed to proceed.    History of Present Illness: Dorothy Whitaker is a 28 y.o. who identifies as a female who was assigned female at birth, and is being seen today for recurring issue with vaginal discharge, irritation and odor.  Patient has had substantial issue with recurring BV over the past 6 months, initially diagnosed at outside urgent care.  Denies fever, chills.  Is not currently sexually active.  Notes testing after her last partner that was negative for STI.  Notes previously the vaginal gel has worked better for her.Marland Kitchen  HPI: HPI  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   Panic attack 06/26/2021   Symptomatic anemia 11/15/2019   Heavy menstrual bleeding 11/15/2019   Asthma    Acanthosis nigricans 09/21/2015   Body mass index (BMI) of 36.0-36.9 in adult 07/17/2015    Allergies: No Known Allergies Medications:  Current Outpatient Medications:    metroNIDAZOLE (METROGEL) 0.75 % vaginal gel, Place 1 Applicatorful vaginally at bedtime., Disp: 70 g, Rfl: 0   albuterol (VENTOLIN HFA) 108 (90 Base) MCG/ACT inhaler, Inhale 2 puffs into the lungs every 6 (six) hours as needed for wheezing or shortness of breath., Disp: 8 g, Rfl: 0  azelastine (ASTELIN) 0.1 % nasal spray, Place 1 spray into both nostrils 2 (two) times daily. Use in each nostril as directed, Disp: 30 mL, Rfl: 0   benzonatate (TESSALON) 200 MG capsule, Take 1 capsule (200 mg total) by mouth 2 (two) times daily as needed for cough., Disp: 20 capsule, Rfl: 0   Blood Glucose Monitoring Suppl DEVI, 1 each by Does not apply route in the morning, at noon, and at bedtime. May substitute to any  manufacturer covered by patient's insurance., Disp: 1 each, Rfl: 0   brompheniramine-pseudoephedrine-DM 30-2-10 MG/5ML syrup, Take 5 mLs by mouth 4 (four) times daily as needed., Disp: 240 mL, Rfl: 0   clonazePAM (KLONOPIN) 0.5 MG tablet, Take 1 tablet (0.5 mg total) by mouth 2 (two) times daily as needed for anxiety. (Patient not taking: Reported on 12/04/2023), Disp: 20 tablet, Rfl: 0   ferrous sulfate 300 (60 Fe) MG/5ML syrup, Take 5 mLs (300 mg total) by mouth daily., Disp: 500 mL, Rfl: 0   FLUoxetine (PROZAC) 20 MG capsule, Take 1 capsule (20 mg total) by mouth daily. (Patient not taking: Reported on 12/04/2023), Disp: 30 capsule, Rfl: 0   hydrOXYzine (ATARAX) 10 MG tablet, Take 1 tablet (10 mg total) by mouth 3 (three) times daily as needed for anxiety. (Patient not taking: Reported on 12/04/2023), Disp: 30 tablet, Rfl: 0   levocetirizine (XYZAL) 5 MG tablet, Take 1 tablet (5 mg total) by mouth every evening. (Patient not taking: Reported on 12/04/2023), Disp: 30 tablet, Rfl: 0   metFORMIN (GLUCOPHAGE) 500 MG tablet, Take 1 tablet (500 mg total) by mouth daily with breakfast. (Patient not taking: Reported on 12/04/2023), Disp: 30 tablet, Rfl: 1   phenazopyridine (PYRIDIUM) 100 MG tablet, Take 1 tablet (100 mg total) by mouth 3 (three) times daily as needed for pain. (Patient not taking: Reported on 12/04/2023), Disp: 10 tablet, Rfl: 0   predniSONE (STERAPRED UNI-PAK 21 TAB) 10 MG (21) TBPK tablet, Use as directed, Disp: 21 tablet, Rfl: 0   promethazine-dextromethorphan (PROMETHAZINE-DM) 6.25-15 MG/5ML syrup, Take 5 mLs by mouth 3 (three) times daily as needed for cough., Disp: 118 mL, Rfl: 0   Vitamin D, Ergocalciferol, (DRISDOL) 1.25 MG (50000 UNIT) CAPS capsule, Take 1 capsule (50,000 Units total) by mouth every 7 (seven) days. (Patient not taking: Reported on 08/06/2022), Disp: 12 capsule, Rfl: 0  Observations/Objective: Patient is well-developed, well-nourished in no acute distress.  Resting  comfortably at home.  Head is normocephalic, atraumatic.  No labored breathing. Speech is clear and coherent with logical content.  Patient is alert and oriented at baseline.   Assessment and Plan: 1. Recurrent vaginitis (Primary) - metroNIDAZOLE (METROGEL) 0.75 % vaginal gel; Place 1 Applicatorful vaginally at bedtime.  Dispense: 70 g; Refill: 0  Recurring issue.  Discussed she needs to follow-up with the GYN for ongoing assessment and management.  Will provide 1 final prescription for MetroGel nightly for 7 days.  Recommend boric acid vaginal suppositories.  She is to keep a symptom journal as well as keeping watch over any new hygiene products she is using as potential triggers.  Follow Up Instructions: I discussed the assessment and treatment plan with the patient. The patient was provided an opportunity to ask questions and all were answered. The patient agreed with the plan and demonstrated an understanding of the instructions.  A copy of instructions were sent to the patient via MyChart unless otherwise noted below.   The patient was advised to call back or seek an in-person  evaluation if the symptoms worsen or if the condition fails to improve as anticipated.    Hyla Maillard, PA-C

## 2024-02-18 ENCOUNTER — Telehealth: Payer: Self-pay | Admitting: Physician Assistant

## 2024-02-18 DIAGNOSIS — N760 Acute vaginitis: Secondary | ICD-10-CM

## 2024-02-18 NOTE — Patient Instructions (Signed)
 Dorothy Whitaker, thank you for joining [name], PA-C for today's virtual visit.  While this provider is not your primary care provider (PCP), if your PCP is located in our provider database this encounter information will be shared with them immediately following your visit.   A [facility] MyChart account gives you access to today's visit and all your visits, tests, and labs performed at [facility] " click here if you don't have a [facility] MyChart account or go to [email or web address]  Consent: (Patient) Dorothy Whitaker provided verbal consent for this virtual visit at the beginning of the encounter.  Current Medications:  Current Outpatient Medications:    albuterol (VENTOLIN HFA) 108 (90 Base) MCG/ACT inhaler, Inhale 2 puffs into the lungs every 6 (six) hours as needed for wheezing or shortness of breath., Disp: 8 g, Rfl: 0   azelastine (ASTELIN) 0.1 % nasal spray, Place 1 spray into both nostrils 2 (two) times daily. Use in each nostril as directed, Disp: 30 mL, Rfl: 0   benzonatate (TESSALON) 200 MG capsule, Take 1 capsule (200 mg total) by mouth 2 (two) times daily as needed for cough., Disp: 20 capsule, Rfl: 0   Blood Glucose Monitoring Suppl DEVI, 1 each by Does not apply route in the morning, at noon, and at bedtime. May substitute to any manufacturer covered by patient's insurance., Disp: 1 each, Rfl: 0   brompheniramine-pseudoephedrine-DM 30-2-10 MG/5ML syrup, Take 5 mLs by mouth 4 (four) times daily as needed., Disp: 240 mL, Rfl: 0   clonazePAM (KLONOPIN) 0.5 MG tablet, Take 1 tablet (0.5 mg total) by mouth 2 (two) times daily as needed for anxiety. (Patient not taking: Reported on 12/04/2023), Disp: 20 tablet, Rfl: 0   ferrous sulfate 300 (60 Fe) MG/5ML syrup, Take 5 mLs (300 mg total) by mouth daily., Disp: 500 mL, Rfl: 0   FLUoxetine (PROZAC) 20 MG capsule, Take 1 capsule (20 mg total) by mouth daily. (Patient not taking: Reported on 12/04/2023),  Disp: 30 capsule, Rfl: 0   hydrOXYzine (ATARAX) 10 MG tablet, Take 1 tablet (10 mg total) by mouth 3 (three) times daily as needed for anxiety. (Patient not taking: Reported on 12/04/2023), Disp: 30 tablet, Rfl: 0   levocetirizine (XYZAL) 5 MG tablet, Take 1 tablet (5 mg total) by mouth every evening. (Patient not taking: Reported on 12/04/2023), Disp: 30 tablet, Rfl: 0   metFORMIN (GLUCOPHAGE) 500 MG tablet, Take 1 tablet (500 mg total) by mouth daily with breakfast. (Patient not taking: Reported on 12/04/2023), Disp: 30 tablet, Rfl: 1   metroNIDAZOLE (METROGEL) 0.75 % vaginal gel, Place 1 Applicatorful vaginally at bedtime., Disp: 70 g, Rfl: 0   phenazopyridine (PYRIDIUM) 100 MG tablet, Take 1 tablet (100 mg total) by mouth 3 (three) times daily as needed for pain. (Patient not taking: Reported on 12/04/2023), Disp: 10 tablet, Rfl: 0   predniSONE (STERAPRED UNI-PAK 21 TAB) 10 MG (21) TBPK tablet, Use as directed, Disp: 21 tablet, Rfl: 0   promethazine-dextromethorphan (PROMETHAZINE-DM) 6.25-15 MG/5ML syrup, Take 5 mLs by mouth 3 (three) times daily as needed for cough., Disp: 118 mL, Rfl: 0   Vitamin D, Ergocalciferol, (DRISDOL) 1.25 MG (50000 UNIT) CAPS capsule, Take 1 capsule (50,000 Units total) by mouth every 7 (seven) days. (Patient not taking: Reported on 08/06/2022), Disp: 12 capsule, Rfl: 0   Medications ordered in this encounter:  No orders of the defined types were placed in this encounter.    *If you need refills on other medications  prior to your next appointment, please contact your pharmacy*  Follow-Up: Call back or seek an in-person evaluation if the symptoms worsen or if the condition fails to improve as anticipated.  [facility] [phone]  Other Instructions Please complete your treatment course. I have placed a Diflucan on file in case of any yeast from antibiotic use.  Refrain from unprotected sex with your partner if this is always triggering your  symptoms. Consider use of OTC boric acid vaginal suppository post any unprotected intercourse to help level out the pH.  Please use link below to get set up with PCP/GYN for ongoing assessment and management.  Boric Acid Vaginal Suppositories What is this medication? BORIC ACID (BOHR ik AS id) may support vaginal health. It may relieve the symptoms of a yeast infection, such as itching, burning, and odor. This medicine may be used for other purposes; ask your health care provider or pharmacist if you have questions. COMMON BRAND NAME(S): AZO Boric Acid with Aloe Vera, Hylafem What should I tell my care team before I take this medication? They need to know if you have any of these conditions: Diabetes Frequent infections HIV or AIDS Immune system problems An unusual or allergic reaction to boric acid, other medications, foods, dyes, or preservatives Pregnant or trying to get pregnant Breast-feeding How should I use this medication? This medication is for use in the vagina. Do not take by mouth. Follow the directions on the prescription label. Read package directions carefully before using. Wash hands before and after use. Use this medication at bedtime, unless otherwise directed by your care team. Do not use your medication more often than directed. Do not stop using this medication except on your care team's advice. Talk to your care team about the use of this medication in children. This medication is not approved for use in children. Overdosage: If you think you have taken too much of this medicine contact a poison control center or emergency room at once. NOTE: This medicine is only for you. Do not share this medicine with others. What if I miss a dose? If you miss a dose, use it as soon as you can. If it is almost time for your next dose, use only that dose. Do not use double or extra doses. What may interact with this medication? Interactions are not expected. Do not use any other  vaginal products without telling your care team. This list may not describe all possible interactions. Give your health care provider a list of all the medicines, herbs, non-prescription drugs, or dietary supplements you use. Also tell them if you smoke, drink alcohol, or use illegal drugs. Some items may interact with your medicine. What should I watch for while using this medication? Tell your care team if your symptoms do not start to get better within a few days. It is better not to have sex until you have finished your treatment. This medication may cause condoms, diaphragms, and spermicides to not work as well. Do not rely on any of these methods to prevent sexually transmitted infections (STIs) or pregnancy while you are using this medication. Vaginal medications may come out of the vagina during treatment. To keep the medication from getting on your clothing, wear a panty liner. The use of tampons is not recommended. To help clear up the infection, wear freshly washed cotton, not synthetic, underwear. What side effects may I notice from receiving this medication? Side effects that you should report to your care team  as soon as possible: Allergic reactions--skin rash, itching, hives, swelling of the face, lips, tongue, or throat Unusual vaginal discharge, itching, or odor Side effects that usually do not require medical attention (report to your care team if they continue or are bothersome): Vaginal irritation at the application site This list may not describe all possible side effects. Call your doctor for medical advice about side effects. You may report side effects to FDA at 1-800-FDA-1088. Where should I keep my medication? Keep out of the reach of children and pets. Store in a cool, dry place between 15 and 30 degrees C (59 and 86 degrees F). Keep away from sunlight. Throw away any unused medication after the expiration date. NOTE: This sheet is a summary. It may not cover all possible  information. If you have questions about this medicine, talk to your doctor, pharmacist, or health care provider.  2024 Elsevier/Gold Standard (2021-10-07 00:00:00)   If you have been instructed to have an in-person evaluation today at a local Urgent Care facility, please use the link below. It will take you to a list of all of our available [facility], including address, phone number and hours of operation. Please do not delay care.  [facility]  If you or a family member do not have a primary care provider, use the link below to schedule a visit and establish care. When you choose a [facility] primary care physician or advanced practice provider, you gain a long-term partner in health. Find a Primary Care Provider  I would recommend [facility] as a GYN office near you.  Learn more about [facility]'s in-office and virtual care options:  - Get Care Now

## 2024-02-18 NOTE — Progress Notes (Signed)
 Virtual Visit Consent   Dorothy Whitaker, you are scheduled for a virtual visit with a [facility] provider today. Just as with appointments in the office, your consent must be obtained to participate. Your consent will be active for this visit and any virtual visit you may have with one of our providers in the next 365 days. If you have a MyChart account, a copy of this consent can be sent to you electronically.  As this is a virtual visit, video technology does not allow for your provider to perform a traditional examination. This may limit your provider's ability to fully assess your condition. If your provider identifies any concerns that need to be evaluated in person or the need to arrange testing (such as labs, EKG, etc.), we will make arrangements to do so. Although advances in technology are sophisticated, we cannot ensure that it will always work on either your end or our end. If the connection with a video visit is poor, the visit may have to be switched to a telephone visit. With either a video or telephone visit, we are not always able to ensure that we have a secure connection.  By engaging in this virtual visit, you consent to the provision of healthcare and authorize for your insurance to be billed (if applicable) for the services provided during this visit. Depending on your insurance coverage, you may receive a charge related to this service.  I need to obtain your verbal consent now. Are you willing to proceed with your visit today? Dorothy Whitaker has provided verbal consent on 02/18/2024 for a virtual visit (video or telephone). Hyla Maillard, [place]  Date: 02/18/2024 7:59 AM   Virtual Visit via Video Note   I, Hyla Maillard, connected with  Dorothy Whitaker  ([id number], 1996-03-28) on 02/18/24 at  7:45 AM EDT by a video-enabled telemedicine application and verified that I am speaking with the correct person using two identifiers.  Location: Patient: Virtual Visit  Location Patient: Home Provider: Virtual Visit Location Provider: Home Office   I discussed the limitations of evaluation and management by telemedicine and the availability of in person appointments. The patient expressed understanding and agreed to proceed.    History of Present Illness: Dorothy Whitaker is a 28 y.o. who identifies as a female who was assigned female at birth, and is being seen today for for follow-up question from visit 2 days ago regarding recurrent vaginitis. Notes she could not tell everything as she had someone else around her. Notes her episodes of vaginitis only happen after intercourse with her partner. They have both been tested multiple times and negative for STI. Female partner is asymptomatic. Denies condom use. Always seems to happen if he ejaculates inside of her. Is wanting to know what options she has for prevention.    HPI: HPI  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   Panic attack 06/26/2021   Symptomatic anemia 11/15/2019   Heavy menstrual bleeding 11/15/2019   Asthma    Acanthosis nigricans 09/21/2015   Body mass index (BMI) of 36.0-36.9 in adult 07/17/2015    Allergies: No Known Allergies Medications:  Current Outpatient Medications:    albuterol (VENTOLIN HFA) 108 (90 Base) MCG/ACT inhaler, Inhale 2 puffs into the lungs every 6 (six) hours as needed for wheezing or shortness of breath., Disp: 8 g, Rfl: 0   azelastine (ASTELIN) 0.1 %  nasal spray, Place 1 spray into both nostrils 2 (two) times daily. Use in each nostril as directed, Disp: 30 mL, Rfl: 0   benzonatate (TESSALON) 200 MG capsule, Take 1 capsule (200 mg total) by mouth 2 (two) times daily as needed for cough., Disp: 20 capsule, Rfl: 0   Blood Glucose Monitoring Suppl DEVI, 1 each by Does not apply route in the morning, at noon, and at bedtime. May substitute to any manufacturer covered by  patient's insurance., Disp: 1 each, Rfl: 0   brompheniramine-pseudoephedrine-DM 30-2-10 MG/5ML syrup, Take 5 mLs by mouth 4 (four) times daily as needed., Disp: 240 mL, Rfl: 0   clonazePAM (KLONOPIN) 0.5 MG tablet, Take 1 tablet (0.5 mg total) by mouth 2 (two) times daily as needed for anxiety. (Patient not taking: Reported on 12/04/2023), Disp: 20 tablet, Rfl: 0   ferrous sulfate 300 (60 Fe) MG/5ML syrup, Take 5 mLs (300 mg total) by mouth daily., Disp: 500 mL, Rfl: 0   FLUoxetine (PROZAC) 20 MG capsule, Take 1 capsule (20 mg total) by mouth daily. (Patient not taking: Reported on 12/04/2023), Disp: 30 capsule, Rfl: 0   hydrOXYzine (ATARAX) 10 MG tablet, Take 1 tablet (10 mg total) by mouth 3 (three) times daily as needed for anxiety. (Patient not taking: Reported on 12/04/2023), Disp: 30 tablet, Rfl: 0   levocetirizine (XYZAL) 5 MG tablet, Take 1 tablet (5 mg total) by mouth every evening. (Patient not taking: Reported on 12/04/2023), Disp: 30 tablet, Rfl: 0   metFORMIN (GLUCOPHAGE) 500 MG tablet, Take 1 tablet (500 mg total) by mouth daily with breakfast. (Patient not taking: Reported on 12/04/2023), Disp: 30 tablet, Rfl: 1   metroNIDAZOLE (METROGEL) 0.75 % vaginal gel, Place 1 Applicatorful vaginally at bedtime., Disp: 70 g, Rfl: 0   phenazopyridine (PYRIDIUM) 100 MG tablet, Take 1 tablet (100 mg total) by mouth 3 (three) times daily as needed for pain. (Patient not taking: Reported on 12/04/2023), Disp: 10 tablet, Rfl: 0   predniSONE (STERAPRED UNI-PAK 21 TAB) 10 MG (21) TBPK tablet, Use as directed, Disp: 21 tablet, Rfl: 0   promethazine-dextromethorphan (PROMETHAZINE-DM) 6.25-15 MG/5ML syrup, Take 5 mLs by mouth 3 (three) times daily as needed for cough., Disp: 118 mL, Rfl: 0   Vitamin D, Ergocalciferol, (DRISDOL) 1.25 MG (50000 UNIT) CAPS capsule, Take 1 capsule (50,000 Units total) by mouth every 7 (seven) days. (Patient not taking: Reported on 08/06/2022), Disp: 12 capsule, Rfl:  0  Observations/Objective: Patient is well-developed, well-nourished in no acute distress.  Resting comfortably  at home.  Head is normocephalic, atraumatic.  No labored breathing.  Speech is clear and coherent with logical content.  Patient is alert and oriented at baseline.   Assessment and Plan: 1. Recurrent vaginitis (Primary)  Current episode that she was evaluated for two days ago. Is taking Metrogel as directed. Discussed giving negative STI testing for both of them, it could be sensitivity to semen or potentially just the trauma occurring from penetration that is throwing her pH balance off. Recommend consideration of condom use. Also recommend for times they have not been consistently using, that she consider use of boric acid vaginal suppository. Also recommend she establish with a GYN -- link sent to help her set this up.  Follow Up Instructions: I discussed the assessment and treatment plan with the patient. The patient was provided an opportunity to ask questions and all were answered. The patient agreed with the plan and demonstrated an understanding of the instructions.  A copy of  instructions were sent to the patient via MyChart unless otherwise noted below.    The patient was advised to call back or seek an in-person evaluation if the symptoms worsen or if the condition fails to improve as anticipated.    Hyla Maillard, PA-C

## 2024-03-22 ENCOUNTER — Telehealth: Payer: Self-pay

## 2024-07-14 ENCOUNTER — Telehealth: Payer: Self-pay | Admitting: Nurse Practitioner

## 2024-07-14 DIAGNOSIS — J302 Other seasonal allergic rhinitis: Secondary | ICD-10-CM

## 2024-07-14 MED ORDER — LEVOCETIRIZINE DIHYDROCHLORIDE 5 MG PO TABS: 5.0000 mg | ORAL_TABLET | Freq: Every evening | ORAL | 0 refills | Status: AC

## 2024-07-14 MED ORDER — IPRATROPIUM BROMIDE 0.03 % NA SOLN: 2.0000 | Freq: Two times a day (BID) | NASAL | 12 refills | Status: AC

## 2024-07-14 NOTE — Progress Notes (Signed)
 Virtual Visit Consent   Dorothy Whitaker, you are scheduled for a virtual visit with a [facility] provider today. Just as with appointments in the office, your consent must be obtained to participate. Your consent will be active for this visit and any virtual visit you may have with one of our providers in the next 365 days. If you have a MyChart account, a copy of this consent can be sent to you electronically.  As this is a virtual visit, video technology does not allow for your provider to perform a traditional examination. This may limit your provider's ability to fully assess your condition. If your provider identifies any concerns that need to be evaluated in person or the need to arrange testing (such as labs, EKG, etc.), we will make arrangements to do so. Although advances in technology are sophisticated, we cannot ensure that it will always work on either your end or our end. If the connection with a video visit is poor, the visit may have to be switched to a telephone visit. With either a video or telephone visit, we are not always able to ensure that we have a secure connection.  By engaging in this virtual visit, you consent to the provision of healthcare and authorize for your insurance to be billed (if applicable) for the services provided during this visit. Depending on your insurance coverage, you may receive a charge related to this service.  I need to obtain your verbal consent now. Are you willing to proceed with your visit today? Dorothy Whitaker has provided verbal consent on 07/14/2024 for a virtual visit (video or telephone). Dorothy LELON Servant, NP  Date: 07/14/2024 3:42 PM   Virtual Visit via Video Note   I, Dorothy Whitaker, connected with  Dorothy Whitaker  ([id number], 07-Mar-1996) on 07/14/24 at  3:30 PM EDT by a video-enabled telemedicine application and verified that I am speaking with the correct person using two identifiers.  Location: Patient: Virtual Visit Location  Patient: Home Provider: Virtual Visit Location Provider: Home Office   I discussed the limitations of evaluation and management by telemedicine and the availability of in person appointments. The patient expressed understanding and agreed to proceed.    History of Present Illness: Dorothy Whitaker is a 28 y.o. who identifies as a female who was assigned female at birth, and is being seen today for seasonal allergy symptoms.  Dorothy Whitaker has been experiencing severe allergy symptoms due to recent temperature changes. She is requesting a steroid nasal spray and antihistamine today for her symptoms of sinus pressure, headache, nasal congestion, frontal pressure.   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   Panic attack 06/26/2021   Symptomatic anemia 11/15/2019   Heavy menstrual bleeding 11/15/2019   Asthma    Acanthosis nigricans 09/21/2015   Body mass index (BMI) of 36.0-36.9 in adult 07/17/2015    Allergies: No Known Allergies Medications:  Current Outpatient Medications:    ipratropium (ATROVENT ) 0.03 % nasal spray, Place 2 sprays into both nostrils every 12 (twelve) hours., Disp: 30 mL, Rfl: 12   albuterol  (VENTOLIN  HFA) 108 (90 Base) MCG/ACT inhaler, Inhale 2 puffs into the lungs every 6 (six) hours as needed for wheezing or shortness of breath., Disp: 8 g, Rfl: 0   azelastine  (ASTELIN ) 0.1 % nasal spray, Place 1 spray into both nostrils 2 (two) times daily. Use in each nostril as  directed, Disp: 30 mL, Rfl: 0   benzonatate  (TESSALON ) 200 MG capsule, Take 1 capsule (200 mg total) by mouth 2 (two) times daily as needed for cough., Disp: 20 capsule, Rfl: 0   Blood Glucose Monitoring Suppl DEVI, 1 each by Does not apply route in the morning, at noon, and at bedtime. May substitute to any manufacturer covered by patient's insurance., Disp: 1 each, Rfl: 0    brompheniramine-pseudoephedrine-DM 30-2-10 MG/5ML syrup, Take 5 mLs by mouth 4 (four) times daily as needed., Disp: 240 mL, Rfl: 0   clonazePAM  (KLONOPIN ) 0.5 MG tablet, Take 1 tablet (0.5 mg total) by mouth 2 (two) times daily as needed for anxiety. (Patient not taking: Reported on 12/04/2023), Disp: 20 tablet, Rfl: 0   ferrous sulfate  300 (60 Fe) MG/5ML syrup, Take 5 mLs (300 mg total) by mouth daily., Disp: 500 mL, Rfl: 0   FLUoxetine  (PROZAC ) 20 MG capsule, Take 1 capsule (20 mg total) by mouth daily. (Patient not taking: Reported on 12/04/2023), Disp: 30 capsule, Rfl: 0   hydrOXYzine  (ATARAX ) 10 MG tablet, Take 1 tablet (10 mg total) by mouth 3 (three) times daily as needed for anxiety. (Patient not taking: Reported on 12/04/2023), Disp: 30 tablet, Rfl: 0   levocetirizine (XYZAL ) 5 MG tablet, Take 1 tablet (5 mg total) by mouth every evening., Disp: 30 tablet, Rfl: 0   metFORMIN  (GLUCOPHAGE ) 500 MG tablet, Take 1 tablet (500 mg total) by mouth daily with breakfast. (Patient not taking: Reported on 12/04/2023), Disp: 30 tablet, Rfl: 1   metroNIDAZOLE  (METROGEL ) 0.75 % vaginal gel, Place 1 Applicatorful vaginally at bedtime., Disp: 70 g, Rfl: 0   phenazopyridine  (PYRIDIUM ) 100 MG tablet, Take 1 tablet (100 mg total) by mouth 3 (three) times daily as needed for pain. (Patient not taking: Reported on 12/04/2023), Disp: 10 tablet, Rfl: 0   predniSONE  (STERAPRED UNI-PAK 21 TAB) 10 MG (21) TBPK tablet, Use as directed, Disp: 21 tablet, Rfl: 0   promethazine -dextromethorphan (PROMETHAZINE -DM) 6.25-15 MG/5ML syrup, Take 5 mLs by mouth 3 (three) times daily as needed for cough., Disp: 118 mL, Rfl: 0   Vitamin D , Ergocalciferol , (DRISDOL ) 1.25 MG (50000 UNIT) CAPS capsule, Take 1 capsule (50,000 Units total) by mouth every 7 (seven) days. (Patient not taking: Reported on 08/06/2022), Disp: 12 capsule, Rfl: 0  Observations/Objective: Patient is well-developed, well-nourished in no acute distress.  Resting  comfortably at home.  Head is normocephalic, atraumatic.  No labored breathing.  Speech is clear and coherent with logical content.  Patient is alert and oriented at baseline.    Assessment and Plan: 1. Seasonal allergic rhinitis, unspecified trigger - levocetirizine (XYZAL ) 5 MG tablet; Take 1 tablet (5 mg total) by mouth every evening.  Dispense: 30 tablet; Refill: 0 - ipratropium (ATROVENT ) 0.03 % nasal spray; Place 2 sprays into both nostrils every 12 (twelve) hours.  Dispense: 30 mL; Refill: 12   Follow Up Instructions: I discussed the assessment and treatment plan with the patient. The patient was provided an opportunity to ask questions and all were answered. The patient agreed with the plan and demonstrated an understanding of the instructions.  A copy of instructions were sent to the patient via MyChart unless otherwise noted below.    The patient was advised to call back or seek an in-person evaluation if the symptoms worsen or if the condition fails to improve as anticipated.    Hyde Sires W Jamall Strohmeier, NP

## 2024-07-14 NOTE — Patient Instructions (Signed)
 Dorothy Whitaker, thank you for joining Haze LELON Servant, NP for today's virtual visit.  While this provider is not your primary care provider (PCP), if your PCP is located in our provider database this encounter information will be shared with them immediately following your visit.   A [facility] MyChart account gives you access to today's visit and all your visits, tests, and labs performed at [facility]  click here if you don't have a  MyChart account or go to [email or web address]  Consent: (Patient) Dorothy Whitaker provided verbal consent for this virtual visit at the beginning of the encounter.  Current Medications:  Current Outpatient Medications:    albuterol  (VENTOLIN  HFA) 108 (90 Base) MCG/ACT inhaler, Inhale 2 puffs into the lungs every 6 (six) hours as needed for wheezing or shortness of breath., Disp: 8 g, Rfl: 0   azelastine  (ASTELIN ) 0.1 % nasal spray, Place 1 spray into both nostrils 2 (two) times daily. Use in each nostril as directed, Disp: 30 mL, Rfl: 0   benzonatate  (TESSALON ) 200 MG capsule, Take 1 capsule (200 mg total) by mouth 2 (two) times daily as needed for cough., Disp: 20 capsule, Rfl: 0   Blood Glucose Monitoring Suppl DEVI, 1 each by Does not apply route in the morning, at noon, and at bedtime. May substitute to any manufacturer covered by patient's insurance., Disp: 1 each, Rfl: 0   brompheniramine-pseudoephedrine-DM 30-2-10 MG/5ML syrup, Take 5 mLs by mouth 4 (four) times daily as needed., Disp: 240 mL, Rfl: 0   clonazePAM  (KLONOPIN ) 0.5 MG tablet, Take 1 tablet (0.5 mg total) by mouth 2 (two) times daily as needed for anxiety. (Patient not taking: Reported on 12/04/2023), Disp: 20 tablet, Rfl: 0   ferrous sulfate  300 (60 Fe) MG/5ML syrup, Take 5 mLs (300 mg total) by mouth daily., Disp: 500 mL, Rfl: 0   FLUoxetine  (PROZAC ) 20 MG capsule, Take 1 capsule (20 mg total) by mouth daily. (Patient not taking: Reported on 12/04/2023), Disp:  30 capsule, Rfl: 0   hydrOXYzine  (ATARAX ) 10 MG tablet, Take 1 tablet (10 mg total) by mouth 3 (three) times daily as needed for anxiety. (Patient not taking: Reported on 12/04/2023), Disp: 30 tablet, Rfl: 0   levocetirizine (XYZAL ) 5 MG tablet, Take 1 tablet (5 mg total) by mouth every evening. (Patient not taking: Reported on 12/04/2023), Disp: 30 tablet, Rfl: 0   metFORMIN  (GLUCOPHAGE ) 500 MG tablet, Take 1 tablet (500 mg total) by mouth daily with breakfast. (Patient not taking: Reported on 12/04/2023), Disp: 30 tablet, Rfl: 1   metroNIDAZOLE  (METROGEL ) 0.75 % vaginal gel, Place 1 Applicatorful vaginally at bedtime., Disp: 70 g, Rfl: 0   phenazopyridine  (PYRIDIUM ) 100 MG tablet, Take 1 tablet (100 mg total) by mouth 3 (three) times daily as needed for pain. (Patient not taking: Reported on 12/04/2023), Disp: 10 tablet, Rfl: 0   predniSONE  (STERAPRED UNI-PAK 21 TAB) 10 MG (21) TBPK tablet, Use as directed, Disp: 21 tablet, Rfl: 0   promethazine -dextromethorphan (PROMETHAZINE -DM) 6.25-15 MG/5ML syrup, Take 5 mLs by mouth 3 (three) times daily as needed for cough., Disp: 118 mL, Rfl: 0   Vitamin D , Ergocalciferol , (DRISDOL ) 1.25 MG (50000 UNIT) CAPS capsule, Take 1 capsule (50,000 Units total) by mouth every 7 (seven) days. (Patient not taking: Reported on 08/06/2022), Disp: 12 capsule, Rfl: 0   Medications ordered in this encounter:  No orders of the defined types were placed in this encounter.    *If you need refills on other medications  prior to your next appointment, please contact your pharmacy*  Follow-Up: Call back or seek an in-person evaluation if the symptoms worsen or if the condition fails to improve as anticipated.  [facility] [phone]    If you have been instructed to have an in-person evaluation today at a local Urgent Care facility, please use the link below. It will take you to a list of all of our available [facility], including address, phone  number and hours of operation. Please do not delay care.  [facility]  If you or a family member do not have a primary care provider, use the link below to schedule a visit and establish care. When you choose a [facility] primary care physician or advanced practice provider, you gain a long-term partner in health. Find a Primary Care Provider  Learn more about [facility] in-office and virtual care options: [facility] Get Care Now

## 2024-08-05 ENCOUNTER — Other Ambulatory Visit: Payer: Self-pay | Admitting: Nurse Practitioner

## 2024-08-05 DIAGNOSIS — J302 Other seasonal allergic rhinitis: Secondary | ICD-10-CM

## 2024-09-20 ENCOUNTER — Encounter
